# Patient Record
Sex: Female | Born: 1980 | Race: Black or African American | Hispanic: No | Marital: Married | Smoking: Current every day smoker
Health system: Southern US, Community
[De-identification: ages and names within clinical notes are randomized; demographics above are authoritative.]

## PROBLEM LIST (undated history)

## (undated) ENCOUNTER — Inpatient Hospital Stay (HOSPITAL_COMMUNITY): Payer: Self-pay

## (undated) DIAGNOSIS — R87619 Unspecified abnormal cytological findings in specimens from cervix uteri: Secondary | ICD-10-CM

## (undated) DIAGNOSIS — J45909 Unspecified asthma, uncomplicated: Secondary | ICD-10-CM

## (undated) DIAGNOSIS — E669 Obesity, unspecified: Secondary | ICD-10-CM

## (undated) DIAGNOSIS — IMO0002 Reserved for concepts with insufficient information to code with codable children: Secondary | ICD-10-CM

## (undated) DIAGNOSIS — Z8719 Personal history of other diseases of the digestive system: Secondary | ICD-10-CM

## (undated) DIAGNOSIS — I10 Essential (primary) hypertension: Secondary | ICD-10-CM

## (undated) HISTORY — DX: Unspecified abnormal cytological findings in specimens from cervix uteri: R87.619

## (undated) HISTORY — DX: Reserved for concepts with insufficient information to code with codable children: IMO0002

## (undated) HISTORY — DX: Essential (primary) hypertension: I10

---

## 2001-04-17 ENCOUNTER — Emergency Department (HOSPITAL_COMMUNITY): Admission: EM | Admit: 2001-04-17 | Discharge: 2001-04-17 | Payer: Self-pay | Admitting: Emergency Medicine

## 2003-02-27 ENCOUNTER — Ambulatory Visit (HOSPITAL_COMMUNITY): Admission: RE | Admit: 2003-02-27 | Discharge: 2003-02-27 | Payer: Self-pay | Admitting: *Deleted

## 2003-04-12 ENCOUNTER — Encounter (INDEPENDENT_AMBULATORY_CARE_PROVIDER_SITE_OTHER): Payer: Self-pay | Admitting: Specialist

## 2003-04-12 ENCOUNTER — Inpatient Hospital Stay (HOSPITAL_COMMUNITY): Admission: AD | Admit: 2003-04-12 | Discharge: 2003-04-18 | Payer: Self-pay | Admitting: *Deleted

## 2009-04-11 ENCOUNTER — Inpatient Hospital Stay (HOSPITAL_COMMUNITY): Admission: EM | Admit: 2009-04-11 | Discharge: 2009-04-15 | Payer: Self-pay | Admitting: Emergency Medicine

## 2009-07-21 ENCOUNTER — Observation Stay (HOSPITAL_COMMUNITY): Admission: AD | Admit: 2009-07-21 | Discharge: 2009-07-22 | Payer: Self-pay | Admitting: Obstetrics & Gynecology

## 2009-07-21 ENCOUNTER — Ambulatory Visit: Payer: Self-pay | Admitting: Family

## 2009-07-29 ENCOUNTER — Inpatient Hospital Stay (HOSPITAL_COMMUNITY): Admission: AD | Admit: 2009-07-29 | Discharge: 2009-07-29 | Payer: Self-pay | Admitting: Obstetrics & Gynecology

## 2009-07-31 ENCOUNTER — Encounter: Payer: Self-pay | Admitting: Obstetrics & Gynecology

## 2009-07-31 ENCOUNTER — Ambulatory Visit: Payer: Self-pay | Admitting: Obstetrics & Gynecology

## 2009-07-31 LAB — CONVERTED CEMR LAB
ALT: 18 units/L (ref 0–35)
AST: 21 units/L (ref 0–37)
Alkaline Phosphatase: 89 units/L (ref 39–117)
BUN: 4 mg/dL — ABNORMAL LOW (ref 6–23)
CO2: 23 meq/L (ref 19–32)
Calcium: 8.4 mg/dL (ref 8.4–10.5)
Creatinine 24 HR UR: 1608 mg/24hr (ref 700–1800)
Creatinine Clearance: 162 mL/min — ABNORMAL HIGH (ref 75–115)
Creatinine, Ser: 0.69 mg/dL (ref 0.40–1.20)
Glucose, Bld: 88 mg/dL (ref 70–99)
Hgb A: 97.5 % (ref 96.8–97.8)
MCHC: 32 g/dL (ref 30.0–36.0)
Sodium: 137 meq/L (ref 135–145)
Total Bilirubin: 0.2 mg/dL — ABNORMAL LOW (ref 0.3–1.2)

## 2009-08-05 ENCOUNTER — Ambulatory Visit: Payer: Self-pay | Admitting: Obstetrics and Gynecology

## 2009-08-05 LAB — CONVERTED CEMR LAB

## 2009-08-19 ENCOUNTER — Ambulatory Visit: Payer: Self-pay | Admitting: Obstetrics and Gynecology

## 2009-08-21 ENCOUNTER — Ambulatory Visit: Payer: Self-pay | Admitting: Obstetrics & Gynecology

## 2009-08-21 ENCOUNTER — Inpatient Hospital Stay (HOSPITAL_COMMUNITY): Admission: AD | Admit: 2009-08-21 | Discharge: 2009-08-25 | Payer: Self-pay | Admitting: Obstetrics & Gynecology

## 2009-09-02 ENCOUNTER — Ambulatory Visit: Payer: Self-pay | Admitting: Obstetrics and Gynecology

## 2009-09-03 ENCOUNTER — Encounter: Payer: Self-pay | Admitting: Obstetrics and Gynecology

## 2009-09-03 LAB — CONVERTED CEMR LAB
Chlamydia, DNA Probe: NEGATIVE
GC Probe Amp, Genital: NEGATIVE

## 2009-09-09 ENCOUNTER — Ambulatory Visit: Payer: Self-pay | Admitting: Obstetrics and Gynecology

## 2009-09-16 ENCOUNTER — Ambulatory Visit: Payer: Self-pay | Admitting: Obstetrics & Gynecology

## 2009-09-17 ENCOUNTER — Ambulatory Visit: Payer: Self-pay | Admitting: Advanced Practice Midwife

## 2009-09-17 ENCOUNTER — Inpatient Hospital Stay (HOSPITAL_COMMUNITY): Admission: AD | Admit: 2009-09-17 | Discharge: 2009-09-20 | Payer: Self-pay | Admitting: Obstetrics and Gynecology

## 2010-04-06 ENCOUNTER — Emergency Department (HOSPITAL_COMMUNITY)
Admission: EM | Admit: 2010-04-06 | Discharge: 2010-04-06 | Payer: Self-pay | Source: Home / Self Care | Admitting: Emergency Medicine

## 2010-04-09 ENCOUNTER — Emergency Department (HOSPITAL_COMMUNITY)
Admission: EM | Admit: 2010-04-09 | Discharge: 2010-04-09 | Payer: Self-pay | Source: Home / Self Care | Admitting: Emergency Medicine

## 2010-04-09 ENCOUNTER — Emergency Department (HOSPITAL_COMMUNITY)
Admission: EM | Admit: 2010-04-09 | Discharge: 2010-04-09 | Disposition: A | Discharge status: Other Acute Inpatient Hospital | Payer: Self-pay | Source: Home / Self Care | Admitting: Family Medicine

## 2010-07-11 HISTORY — PX: CHOLECYSTECTOMY, LAPAROSCOPIC: SHX56

## 2010-09-23 LAB — HEPATIC FUNCTION PANEL
AST: 128 U/L — ABNORMAL HIGH (ref 0–37)
Albumin: 3.8 g/dL (ref 3.5–5.2)
Alkaline Phosphatase: 347 U/L — ABNORMAL HIGH (ref 39–117)
Indirect Bilirubin: 2.1 mg/dL — ABNORMAL HIGH (ref 0.3–0.9)
Total Bilirubin: 6.3 mg/dL — ABNORMAL HIGH (ref 0.3–1.2)
Total Protein: 7.4 g/dL (ref 6.0–8.3)

## 2010-09-23 LAB — CBC
Hemoglobin: 13.9 g/dL (ref 12.0–15.0)
MCH: 28.8 pg (ref 26.0–34.0)
MCHC: 34.5 g/dL (ref 30.0–36.0)
Platelets: 277 10*3/uL (ref 150–400)
RDW: 13.1 % (ref 11.5–15.5)
WBC: 5.7 10*3/uL (ref 4.0–10.5)

## 2010-09-23 LAB — POCT I-STAT, CHEM 8
Hemoglobin: 15.6 g/dL — ABNORMAL HIGH (ref 12.0–15.0)
Potassium: 3.6 mEq/L (ref 3.5–5.1)
TCO2: 25 mmol/L (ref 0–100)

## 2010-09-23 LAB — POCT URINALYSIS DIPSTICK
Glucose, UA: NEGATIVE mg/dL
Protein, ur: NEGATIVE mg/dL
Urobilinogen, UA: 0.2 mg/dL (ref 0.0–1.0)
pH: 5 (ref 5.0–8.0)

## 2010-09-23 LAB — DIFFERENTIAL
Basophils Relative: 0 % (ref 0–1)
Eosinophils Absolute: 0.3 10*3/uL (ref 0.0–0.7)
Monocytes Absolute: 0.4 10*3/uL (ref 0.1–1.0)
Neutrophils Relative %: 60 % (ref 43–77)

## 2010-09-23 LAB — LIPASE, BLOOD: Lipase: 72 U/L — ABNORMAL HIGH (ref 11–59)

## 2010-09-26 LAB — URINALYSIS, ROUTINE W REFLEX MICROSCOPIC
Bilirubin Urine: NEGATIVE
Bilirubin Urine: NEGATIVE
Glucose, UA: NEGATIVE mg/dL
Ketones, ur: 15 mg/dL — AB
Ketones, ur: 15 mg/dL — AB
Leukocytes, UA: NEGATIVE
Nitrite: NEGATIVE
Nitrite: NEGATIVE
Protein, ur: NEGATIVE mg/dL
Specific Gravity, Urine: 1.02 (ref 1.005–1.030)
Urobilinogen, UA: 0.2 mg/dL (ref 0.0–1.0)
Urobilinogen, UA: 0.2 mg/dL (ref 0.0–1.0)
pH: 6.5 (ref 5.0–8.0)

## 2010-09-26 LAB — CBC
HCT: 34.4 % — ABNORMAL LOW (ref 36.0–46.0)
HCT: 32.1 % — ABNORMAL LOW (ref 36.0–46.0)
Hemoglobin: 11.1 g/dL — ABNORMAL LOW (ref 12.0–15.0)
Hemoglobin: 10.5 g/dL — ABNORMAL LOW (ref 12.0–15.0)
MCHC: 32.3 g/dL (ref 30.0–36.0)
MCV: 80.8 fL (ref 78.0–100.0)
MCV: 80 fL (ref 78.0–100.0)
Platelets: 307 K/uL (ref 150–400)
RBC: 4.26 MIL/uL (ref 3.87–5.11)
RBC: 4.01 MIL/uL (ref 3.87–5.11)
RDW: 15.9 % — ABNORMAL HIGH (ref 11.5–15.5)
WBC: 8.6 K/uL (ref 4.0–10.5)
WBC: 9 10*3/uL (ref 4.0–10.5)

## 2010-09-26 LAB — DIFFERENTIAL
Basophils Absolute: 0 10*3/uL (ref 0.0–0.1)
Basophils Relative: 0 % (ref 0–1)
Eosinophils Absolute: 0.2 10*3/uL (ref 0.0–0.7)
Neutro Abs: 6.4 10*3/uL (ref 1.7–7.7)
Neutrophils Relative %: 75 % (ref 43–77)

## 2010-09-26 LAB — POCT URINALYSIS DIP (DEVICE)
Bilirubin Urine: NEGATIVE
Glucose, UA: NEGATIVE mg/dL
Nitrite: NEGATIVE
pH: 6.5 (ref 5.0–8.0)

## 2010-09-26 LAB — COMPREHENSIVE METABOLIC PANEL
ALT: 22 U/L (ref 0–35)
ALT: 26 U/L (ref 0–35)
AST: 32 U/L (ref 0–37)
AST: 24 U/L (ref 0–37)
Alkaline Phosphatase: 69 U/L (ref 39–117)
BUN: 1 mg/dL — ABNORMAL LOW (ref 6–23)
BUN: 3 mg/dL — ABNORMAL LOW (ref 6–23)
BUN: 5 mg/dL — ABNORMAL LOW (ref 6–23)
CO2: 23 mEq/L (ref 19–32)
CO2: 24 mEq/L (ref 19–32)
Calcium: 8.1 mg/dL — ABNORMAL LOW (ref 8.4–10.5)
Calcium: 9 mg/dL (ref 8.4–10.5)
Chloride: 104 mEq/L (ref 96–112)
Chloride: 104 mEq/L (ref 96–112)
Creatinine, Ser: 0.68 mg/dL (ref 0.4–1.2)
Creatinine, Ser: 0.64 mg/dL (ref 0.4–1.2)
GFR calc non Af Amer: >60 mL/min (ref >60)
GFR calc non Af Amer: >60 mL/min (ref >60)
Glucose, Bld: 143 mg/dL — ABNORMAL HIGH (ref 70–99)
Glucose, Bld: 72 mg/dL (ref 70–99)
Potassium: 3.3 mEq/L — ABNORMAL LOW (ref 3.5–5.1)
Sodium: 135 mEq/L (ref 135–145)
Sodium: 137 mEq/L (ref 135–145)
Total Bilirubin: 0.2 mg/dL — ABNORMAL LOW (ref 0.3–1.2)
Total Bilirubin: 0.5 mg/dL (ref 0.3–1.2)

## 2010-09-26 LAB — WET PREP, GENITAL: Trich, Wet Prep: NONE SEEN

## 2010-09-26 LAB — LIPASE, BLOOD: Lipase: 40 U/L (ref 11–59)

## 2010-09-26 LAB — AMYLASE: Amylase: 233 U/L — ABNORMAL HIGH (ref 0–105)

## 2010-09-26 LAB — RPR: RPR Ser Ql: NONREACTIVE

## 2010-09-26 LAB — URINE MICROSCOPIC-ADD ON

## 2010-09-29 LAB — COMPREHENSIVE METABOLIC PANEL
ALT: 24 U/L (ref 0–35)
ALT: 90 U/L — ABNORMAL HIGH (ref 0–35)
AST: 28 U/L (ref 0–37)
Albumin: 2.9 g/dL — ABNORMAL LOW (ref 3.5–5.2)
Alkaline Phosphatase: 121 U/L — ABNORMAL HIGH (ref 39–117)
Alkaline Phosphatase: 122 U/L — ABNORMAL HIGH (ref 39–117)
Alkaline Phosphatase: 191 U/L — ABNORMAL HIGH (ref 39–117)
BUN: 2 mg/dL — ABNORMAL LOW (ref 6–23)
CO2: 22 mEq/L (ref 19–32)
CO2: 25 mEq/L (ref 19–32)
Chloride: 103 mEq/L (ref 96–112)
Chloride: 105 mEq/L (ref 96–112)
Creatinine, Ser: 0.68 mg/dL (ref 0.4–1.2)
GFR calc Af Amer: >60 mL/min (ref >60)
GFR calc non Af Amer: >60 mL/min (ref >60)
GFR calc non Af Amer: >60 mL/min (ref >60)
Glucose, Bld: 76 mg/dL (ref 70–99)
Potassium: 3.6 mEq/L (ref 3.5–5.1)
Potassium: 4 mEq/L (ref 3.5–5.1)
Potassium: 4.3 mEq/L (ref 3.5–5.1)
Sodium: 135 mEq/L (ref 135–145)
Sodium: 137 mEq/L (ref 135–145)
Total Bilirubin: 0.4 mg/dL (ref 0.3–1.2)
Total Bilirubin: 0.5 mg/dL (ref 0.3–1.2)

## 2010-09-29 LAB — CBC
HCT: 28.4 % — ABNORMAL LOW (ref 36.0–46.0)
HCT: 33.2 % — ABNORMAL LOW (ref 36.0–46.0)
MCHC: 33 g/dL (ref 30.0–36.0)
MCV: 78.1 fL (ref 78.0–100.0)
Platelets: 234 10*3/uL (ref 150–400)
Platelets: 235 10*3/uL (ref 150–400)
Platelets: 288 10*3/uL (ref 150–400)
RBC: 3.63 MIL/uL — ABNORMAL LOW (ref 3.87–5.11)
RBC: 3.73 MIL/uL — ABNORMAL LOW (ref 3.87–5.11)
WBC: 6.3 10*3/uL (ref 4.0–10.5)
WBC: 6.5 10*3/uL (ref 4.0–10.5)
WBC: 7 10*3/uL (ref 4.0–10.5)

## 2010-09-29 LAB — HEPATIC FUNCTION PANEL
ALT: 81 U/L — ABNORMAL HIGH (ref 0–35)
AST: 45 U/L — ABNORMAL HIGH (ref 0–37)
Albumin: 2.3 g/dL — ABNORMAL LOW (ref 3.5–5.2)
Bilirubin, Direct: 0.1 mg/dL (ref 0.0–0.3)
Bilirubin, Direct: 0.2 mg/dL (ref 0.0–0.3)
Indirect Bilirubin: 0.4 mg/dL (ref 0.3–0.9)
Total Bilirubin: 0.6 mg/dL (ref 0.3–1.2)

## 2010-09-29 LAB — POCT URINALYSIS DIP (DEVICE)
Bilirubin Urine: NEGATIVE
Glucose, UA: NEGATIVE mg/dL
Ketones, ur: NEGATIVE mg/dL
Nitrite: NEGATIVE
Protein, ur: 30 mg/dL — AB
pH: 7 (ref 5.0–8.0)

## 2010-09-29 LAB — LIPASE, BLOOD: Lipase: 30 U/L (ref 11–59)

## 2010-10-03 LAB — CBC
HCT: 29.9 % — ABNORMAL LOW (ref 36.0–46.0)
HCT: 33 % — ABNORMAL LOW (ref 36.0–46.0)
Hemoglobin: 10.6 g/dL — ABNORMAL LOW (ref 12.0–15.0)
Hemoglobin: 9.7 g/dL — ABNORMAL LOW (ref 12.0–15.0)
MCHC: 32 g/dL (ref 30.0–36.0)
MCHC: 32.7 g/dL (ref 30.0–36.0)
MCV: 76.2 fL — ABNORMAL LOW (ref 78.0–100.0)
MCV: 76.8 fL — ABNORMAL LOW (ref 78.0–100.0)
MCV: 76.8 fL — ABNORMAL LOW (ref 78.0–100.0)
Platelets: 241 10*3/uL (ref 150–400)
Platelets: 246 10*3/uL (ref 150–400)
RBC: 4.3 MIL/uL (ref 3.87–5.11)
RDW: 17.7 % — ABNORMAL HIGH (ref 11.5–15.5)
WBC: 6.9 10*3/uL (ref 4.0–10.5)

## 2010-10-03 LAB — COMPREHENSIVE METABOLIC PANEL
Albumin: 2.2 g/dL — ABNORMAL LOW (ref 3.5–5.2)
Alkaline Phosphatase: 281 U/L — ABNORMAL HIGH (ref 39–117)
BUN: 2 mg/dL — ABNORMAL LOW (ref 6–23)
Creatinine, Ser: 0.67 mg/dL (ref 0.4–1.2)
Glucose, Bld: 79 mg/dL (ref 70–99)
Potassium: 3.9 mEq/L (ref 3.5–5.1)
Total Bilirubin: 0.4 mg/dL (ref 0.3–1.2)
Total Protein: 5.6 g/dL — ABNORMAL LOW (ref 6.0–8.3)

## 2010-10-03 LAB — AMYLASE: Amylase: 68 U/L (ref 0–105)

## 2010-10-03 LAB — POCT URINALYSIS DIP (DEVICE)
Bilirubin Urine: NEGATIVE
Bilirubin Urine: NEGATIVE
Glucose, UA: NEGATIVE mg/dL
Hgb urine dipstick: NEGATIVE
Ketones, ur: NEGATIVE mg/dL
Protein, ur: NEGATIVE mg/dL
Specific Gravity, Urine: 1.02 (ref 1.005–1.030)
Specific Gravity, Urine: <=1.005 (ref 1.005–1.030)

## 2010-10-03 LAB — LACTATE DEHYDROGENASE: LDH: 177 U/L (ref 94–250)

## 2010-10-03 LAB — URINALYSIS, DIPSTICK ONLY
Bilirubin Urine: NEGATIVE
Ketones, ur: NEGATIVE mg/dL
Nitrite: NEGATIVE
Specific Gravity, Urine: <1.005 — ABNORMAL LOW (ref 1.005–1.030)
Urobilinogen, UA: 0.2 mg/dL (ref 0.0–1.0)
pH: 7 (ref 5.0–8.0)

## 2010-10-03 LAB — URIC ACID: Uric Acid, Serum: 5.6 mg/dL (ref 2.4–7.0)

## 2010-10-14 LAB — DIFFERENTIAL
Basophils Absolute: 0 10*3/uL (ref 0.0–0.1)
Eosinophils Absolute: 0.1 10*3/uL (ref 0.0–0.7)
Eosinophils Relative: 2 % (ref 0–5)
Lymphocytes Relative: 15 % (ref 12–46)
Monocytes Absolute: 0.3 10*3/uL (ref 0.1–1.0)

## 2010-10-14 LAB — BASIC METABOLIC PANEL
BUN: 3 mg/dL — ABNORMAL LOW (ref 6–23)
CO2: 26 mEq/L (ref 19–32)
Chloride: 103 mEq/L (ref 96–112)
Creatinine, Ser: 0.76 mg/dL (ref 0.4–1.2)
Glucose, Bld: 86 mg/dL (ref 70–99)

## 2010-10-14 LAB — CBC
HCT: 32.5 % — ABNORMAL LOW (ref 36.0–46.0)
HCT: 33 % — ABNORMAL LOW (ref 36.0–46.0)
Hemoglobin: 10.9 g/dL — ABNORMAL LOW (ref 12.0–15.0)
MCHC: 33.4 g/dL (ref 30.0–36.0)
MCV: 81.4 fL (ref 78.0–100.0)
Platelets: 258 10*3/uL (ref 150–400)
Platelets: 258 10*3/uL (ref 150–400)
Platelets: 309 10*3/uL (ref 150–400)
RBC: 4.79 MIL/uL (ref 3.87–5.11)
RDW: 15.4 % (ref 11.5–15.5)
WBC: 8.2 10*3/uL (ref 4.0–10.5)
WBC: 8.4 10*3/uL (ref 4.0–10.5)
WBC: 9.1 10*3/uL (ref 4.0–10.5)

## 2010-10-14 LAB — COMPREHENSIVE METABOLIC PANEL
ALT: 12 U/L (ref 0–35)
ALT: 14 U/L (ref 0–35)
AST: 18 U/L (ref 0–37)
AST: 20 U/L (ref 0–37)
Albumin: 2.9 g/dL — ABNORMAL LOW (ref 3.5–5.2)
Albumin: 3.5 g/dL (ref 3.5–5.2)
Alkaline Phosphatase: 48 U/L (ref 39–117)
Alkaline Phosphatase: 51 U/L (ref 39–117)
Alkaline Phosphatase: 62 U/L (ref 39–117)
BUN: 2 mg/dL — ABNORMAL LOW (ref 6–23)
BUN: <1 mg/dL — ABNORMAL LOW (ref 6–23)
CO2: 23 mEq/L (ref 19–32)
Chloride: 103 mEq/L (ref 96–112)
Chloride: 105 mEq/L (ref 96–112)
Chloride: 105 mEq/L (ref 96–112)
GFR calc Af Amer: >60 mL/min (ref >60)
GFR calc Af Amer: >60 mL/min (ref >60)
Glucose, Bld: 112 mg/dL — ABNORMAL HIGH (ref 70–99)
Potassium: 3.3 mEq/L — ABNORMAL LOW (ref 3.5–5.1)
Potassium: 3.5 mEq/L (ref 3.5–5.1)
Potassium: 3.7 mEq/L (ref 3.5–5.1)
Sodium: 135 mEq/L (ref 135–145)
Total Bilirubin: 0.2 mg/dL — ABNORMAL LOW (ref 0.3–1.2)
Total Bilirubin: 0.4 mg/dL (ref 0.3–1.2)
Total Bilirubin: 0.4 mg/dL (ref 0.3–1.2)

## 2010-10-14 LAB — URINALYSIS, ROUTINE W REFLEX MICROSCOPIC
Bilirubin Urine: NEGATIVE
Glucose, UA: NEGATIVE mg/dL
Hgb urine dipstick: NEGATIVE
Protein, ur: NEGATIVE mg/dL

## 2010-10-14 LAB — LIPASE, BLOOD: Lipase: 29 U/L (ref 11–59)

## 2010-10-14 LAB — AMYLASE
Amylase: 106 U/L (ref 27–131)
Amylase: 155 U/L — ABNORMAL HIGH (ref 27–131)

## 2010-11-26 NOTE — Discharge Summary (Signed)
NAME:  [name]                         ACCOUNT NO.:  [id number]   MEDICAL RECORD NO.:  [id number]                   PATIENT TYPE:  INP   LOCATION:  9130                                 FACILITY:  [facility]   PHYSICIAN:  Franchot Mimes, MD                   DATE OF BIRTH:  08/16/1980   DATE OF ADMISSION:  04/12/2003  DATE OF DISCHARGE:  04/18/2003                                 DISCHARGE SUMMARY   DISCHARGE DIAGNOSES:  1. Pregnancy-induced hypertension.  2. Endometritis.  3. Delivery of intrauterine pregnancy.   HISTORY OF PRESENT ILLNESS:  Ms. [name] is a 30 year old G 1 who presented at  40 weeks and 1 day estimated gestational age complaining of painful uterine  contractions.  On admission, she had elevated blood pressures ranging from  in the 140-150s/97-101 range.  In addition, she had 3+ pretibial pitting on  her extremities.  However, she had normal dependent reflexes with no clonus.  The patient was admitted for preeclampsia.  Please see the admission H&P for  full admission details.   HOSPITAL COURSE:  Problem 1.  PREGNANCY-INDUCED HYPERTENSION:  The patient  was admitted to Labor and Delivery, given a 4 gram bolus of magnesium  sulfate followed by 2 grams per hour and also started on Cervidil.  The  Cervidil was then changed to Pitocin later in the morning.  In addition, the  patient was given Stadol for pain relief and magnesium levels were checked  every six hours.  On April 13, 2003, the patient delivered a viable infant  by vacuum-assisted vaginal delivery secondary to nonreassuring fetal heart  tones.  The patient had a second-degree midline perineal laceration repaired  with 3-0 Vicryl under lidocaine anesthesia.  Apgars were 6 and 8 for the  infant.  Magnesium sulfate was discontinued the day following delivery and  the patient was transferred out of the intensive care unit to a regular  floor the day following delivery.   Problem 2.  ENDOMETRITIS:  On October  5th, the patient developed a fever to  101.9 and had increasing abdominal and uterine tenderness.  The patient was  started on gentamicin and clindamycin for a presumed endometritis.  In  addition, urine and blood cultures were sent and IV antibiotics were  continued for three days total.  The patient had been afebrile for greater  than 24 hours at time of discharge.  The patient was then discharged with  Augmentin 875 mg for five additional days.   LABORATORY DATA:  Initial white blood count on October 2nd was 9.3.  Initial  hemoglobin 12.6.  Initial platelets 265.  Final white blood count on October  7th was 8.3, hemoglobin was 10.5, platelets were 258.  PIH panel showed  normal liver function tests, LDH of 156, uric acid of 6.  Urinalysis on  admission showed trace leucocyte esterase, 3-6 white blood cells, and  7-10  red blood cells.  Blood culture obtained October 6th revealed no growth for  five days.  Urine culture obtained October 6th revealed no growth x1 day.  RPR was nonreactive on admission.   DISCHARGE MEDICATIONS:  The patient was discharged home with Augmentin 875  mg p.o. b.i.d. for five days.   DISCHARGE DESTINATION:  The patient was discharged home.    DISCHARGE INSTRUCTIONS:  The patient was instructed to continue her full  course of antibiotics.  She is instructed to return if she developed fevers  again or if she developed any uterine tenderness, nausea, or vomiting.  The  patient was also instructed to keep her postpartum visit at approximately  six weeks.                                               Franchot Mimes, MD    TV/MEDQ  D:  05/19/2003  T:  05/20/2003  Job:  [id number]

## 2010-12-07 ENCOUNTER — Other Ambulatory Visit (HOSPITAL_COMMUNITY): Payer: Self-pay

## 2010-12-07 ENCOUNTER — Inpatient Hospital Stay (HOSPITAL_COMMUNITY)
Admission: EM | Admit: 2010-12-07 | Discharge: 2010-12-12 | DRG: 418 | Disposition: A | Discharge status: Home / Self Care | Payer: Medicaid Other | Source: Other Acute Inpatient Hospital | Attending: General Surgery | Admitting: General Surgery

## 2010-12-07 ENCOUNTER — Emergency Department (HOSPITAL_BASED_OUTPATIENT_CLINIC_OR_DEPARTMENT_OTHER): Payer: Medicaid Other

## 2010-12-07 ENCOUNTER — Emergency Department (HOSPITAL_BASED_OUTPATIENT_CLINIC_OR_DEPARTMENT_OTHER)
Admission: EM | Admit: 2010-12-07 | Discharge: 2010-12-07 | Disposition: A | Discharge status: Other Acute Inpatient Hospital | Payer: Medicaid Other | Attending: Emergency Medicine | Admitting: Emergency Medicine

## 2010-12-07 DIAGNOSIS — R17 Unspecified jaundice: Secondary | ICD-10-CM | POA: Insufficient documentation

## 2010-12-07 DIAGNOSIS — K8020 Calculus of gallbladder without cholecystitis without obstruction: Secondary | ICD-10-CM

## 2010-12-07 DIAGNOSIS — R0789 Other chest pain: Secondary | ICD-10-CM | POA: Diagnosis not present

## 2010-12-07 DIAGNOSIS — K859 Acute pancreatitis without necrosis or infection, unspecified: Secondary | ICD-10-CM | POA: Insufficient documentation

## 2010-12-07 DIAGNOSIS — K8050 Calculus of bile duct without cholangitis or cholecystitis without obstruction: Secondary | ICD-10-CM

## 2010-12-07 DIAGNOSIS — K8066 Calculus of gallbladder and bile duct with acute and chronic cholecystitis without obstruction: Secondary | ICD-10-CM | POA: Diagnosis present

## 2010-12-07 DIAGNOSIS — R748 Abnormal levels of other serum enzymes: Secondary | ICD-10-CM | POA: Insufficient documentation

## 2010-12-07 DIAGNOSIS — R9431 Abnormal electrocardiogram [ECG] [EKG]: Secondary | ICD-10-CM | POA: Diagnosis not present

## 2010-12-07 DIAGNOSIS — R7309 Other abnormal glucose: Secondary | ICD-10-CM | POA: Insufficient documentation

## 2010-12-07 DIAGNOSIS — I10 Essential (primary) hypertension: Secondary | ICD-10-CM | POA: Diagnosis present

## 2010-12-07 DIAGNOSIS — R1011 Right upper quadrant pain: Secondary | ICD-10-CM | POA: Insufficient documentation

## 2010-12-07 LAB — GLUCOSE, CAPILLARY
Glucose-Capillary: 102 mg/dL — ABNORMAL HIGH (ref 70–99)
Glucose-Capillary: 142 mg/dL — ABNORMAL HIGH (ref 70–99)

## 2010-12-07 LAB — DIFFERENTIAL
Basophils Relative: 0 % (ref 0–1)
Monocytes Relative: 8 % (ref 3–12)
Neutro Abs: 9.6 10*3/uL — ABNORMAL HIGH (ref 1.7–7.7)
Neutrophils Relative %: 77 % (ref 43–77)

## 2010-12-07 LAB — CBC
Hemoglobin: 12.4 g/dL (ref 12.0–15.0)
MCH: 25.4 pg — ABNORMAL LOW (ref 26.0–34.0)
RBC: 4.88 MIL/uL (ref 3.87–5.11)
WBC: 12.5 10*3/uL — ABNORMAL HIGH (ref 4.0–10.5)

## 2010-12-07 LAB — COMPREHENSIVE METABOLIC PANEL
ALT: 319 U/L — ABNORMAL HIGH (ref 0–35)
AST: 157 U/L — ABNORMAL HIGH (ref 0–37)
Albumin: 3.9 g/dL (ref 3.5–5.2)
Alkaline Phosphatase: 205 U/L — ABNORMAL HIGH (ref 39–117)
CO2: 24 mEq/L (ref 19–32)
Chloride: 102 mEq/L (ref 96–112)
Creatinine, Ser: 0.8 mg/dL (ref 0.4–1.2)
GFR calc Af Amer: >60 mL/min (ref >60)
GFR calc non Af Amer: >60 mL/min (ref >60)
Potassium: 3.6 mEq/L (ref 3.5–5.1)
Sodium: 141 mEq/L (ref 135–145)
Total Bilirubin: 3.4 mg/dL — ABNORMAL HIGH (ref 0.3–1.2)

## 2010-12-07 LAB — URINALYSIS, ROUTINE W REFLEX MICROSCOPIC
Ketones, ur: NEGATIVE mg/dL
Nitrite: NEGATIVE
Urobilinogen, UA: 1 mg/dL (ref 0.0–1.0)

## 2010-12-07 LAB — URINE MICROSCOPIC-ADD ON

## 2010-12-07 LAB — PREGNANCY, URINE: Preg Test, Ur: NEGATIVE

## 2010-12-08 ENCOUNTER — Inpatient Hospital Stay (HOSPITAL_COMMUNITY): Payer: Medicaid Other

## 2010-12-08 DIAGNOSIS — K8050 Calculus of bile duct without cholangitis or cholecystitis without obstruction: Secondary | ICD-10-CM

## 2010-12-08 DIAGNOSIS — R17 Unspecified jaundice: Secondary | ICD-10-CM

## 2010-12-08 DIAGNOSIS — K859 Acute pancreatitis without necrosis or infection, unspecified: Secondary | ICD-10-CM

## 2010-12-08 LAB — GLUCOSE, CAPILLARY
Glucose-Capillary: 100 mg/dL — ABNORMAL HIGH (ref 70–99)
Glucose-Capillary: 83 mg/dL (ref 70–99)

## 2010-12-08 LAB — COMPREHENSIVE METABOLIC PANEL
Albumin: 3.1 g/dL — ABNORMAL LOW (ref 3.5–5.2)
BUN: 5 mg/dL — ABNORMAL LOW (ref 6–23)
Chloride: 105 mEq/L (ref 96–112)
Creatinine, Ser: 0.69 mg/dL (ref 0.4–1.2)
Glucose, Bld: 82 mg/dL (ref 70–99)
Total Bilirubin: 3.3 mg/dL — ABNORMAL HIGH (ref 0.3–1.2)
Total Protein: 6.6 g/dL (ref 6.0–8.3)

## 2010-12-08 LAB — CBC
MCH: 25.8 pg — ABNORMAL LOW (ref 26.0–34.0)
MCHC: 32.6 g/dL (ref 30.0–36.0)
MCV: 79.2 fL (ref 78.0–100.0)
Platelets: 251 10*3/uL (ref 150–400)

## 2010-12-08 LAB — LIPASE, BLOOD: Lipase: 1197 U/L — ABNORMAL HIGH (ref 11–59)

## 2010-12-09 ENCOUNTER — Inpatient Hospital Stay (HOSPITAL_COMMUNITY): Payer: Medicaid Other

## 2010-12-09 DIAGNOSIS — R740 Nonspecific elevation of levels of transaminase and lactic acid dehydrogenase [LDH]: Secondary | ICD-10-CM

## 2010-12-09 DIAGNOSIS — K859 Acute pancreatitis without necrosis or infection, unspecified: Secondary | ICD-10-CM

## 2010-12-09 DIAGNOSIS — R9431 Abnormal electrocardiogram [ECG] [EKG]: Secondary | ICD-10-CM

## 2010-12-09 DIAGNOSIS — R079 Chest pain, unspecified: Secondary | ICD-10-CM

## 2010-12-09 DIAGNOSIS — K8050 Calculus of bile duct without cholangitis or cholecystitis without obstruction: Secondary | ICD-10-CM

## 2010-12-09 LAB — GLUCOSE, CAPILLARY
Glucose-Capillary: 127 mg/dL — ABNORMAL HIGH (ref 70–99)
Glucose-Capillary: 81 mg/dL (ref 70–99)
Glucose-Capillary: 91 mg/dL (ref 70–99)

## 2010-12-09 LAB — CBC
HCT: 33.6 % — ABNORMAL LOW (ref 36.0–46.0)
Hemoglobin: 10.8 g/dL — ABNORMAL LOW (ref 12.0–15.0)
MCHC: 32.1 g/dL (ref 30.0–36.0)
RBC: 4.3 MIL/uL (ref 3.87–5.11)

## 2010-12-09 LAB — COMPREHENSIVE METABOLIC PANEL
ALT: 156 U/L — ABNORMAL HIGH (ref 0–35)
AST: 55 U/L — ABNORMAL HIGH (ref 0–37)
Alkaline Phosphatase: 206 U/L — ABNORMAL HIGH (ref 39–117)
CO2: 28 mEq/L (ref 19–32)
Calcium: 8.5 mg/dL (ref 8.4–10.5)
Chloride: 102 mEq/L (ref 96–112)
GFR calc Af Amer: >60 mL/min (ref >60)
GFR calc non Af Amer: >60 mL/min (ref >60)
Glucose, Bld: 77 mg/dL (ref 70–99)
Potassium: 3.3 mEq/L — ABNORMAL LOW (ref 3.5–5.1)
Sodium: 138 mEq/L (ref 135–145)
Total Bilirubin: 1 mg/dL (ref 0.3–1.2)

## 2010-12-09 LAB — SURGICAL PCR SCREEN: MRSA, PCR: NEGATIVE

## 2010-12-09 LAB — CK TOTAL AND CKMB (NOT AT ARMC)
CK, MB: 1.2 ng/mL (ref 0.3–4.0)
Total CK: 666 U/L — ABNORMAL HIGH (ref 7–177)

## 2010-12-09 LAB — AMYLASE: Amylase: 139 U/L — ABNORMAL HIGH (ref 0–105)

## 2010-12-10 ENCOUNTER — Other Ambulatory Visit (INDEPENDENT_AMBULATORY_CARE_PROVIDER_SITE_OTHER): Payer: Self-pay | Admitting: General Surgery

## 2010-12-10 ENCOUNTER — Inpatient Hospital Stay (HOSPITAL_COMMUNITY): Payer: Medicaid Other

## 2010-12-10 LAB — CBC
Hemoglobin: 10.6 g/dL — ABNORMAL LOW (ref 12.0–15.0)
MCH: 25.4 pg — ABNORMAL LOW (ref 26.0–34.0)
MCHC: 32.3 g/dL (ref 30.0–36.0)
RDW: 14.9 % (ref 11.5–15.5)

## 2010-12-10 LAB — GLUCOSE, CAPILLARY
Glucose-Capillary: 173 mg/dL — ABNORMAL HIGH (ref 70–99)
Glucose-Capillary: 69 mg/dL — ABNORMAL LOW (ref 70–99)
Glucose-Capillary: 92 mg/dL (ref 70–99)
Glucose-Capillary: 95 mg/dL (ref 70–99)
Glucose-Capillary: 98 mg/dL (ref 70–99)

## 2010-12-10 LAB — COMPREHENSIVE METABOLIC PANEL
ALT: 124 U/L — ABNORMAL HIGH (ref 0–35)
AST: 39 U/L — ABNORMAL HIGH (ref 0–37)
Albumin: 3 g/dL — ABNORMAL LOW (ref 3.5–5.2)
Alkaline Phosphatase: 180 U/L — ABNORMAL HIGH (ref 39–117)
Chloride: 104 mEq/L (ref 96–112)
GFR calc Af Amer: >60 mL/min (ref >60)
Potassium: 3.6 mEq/L (ref 3.5–5.1)
Sodium: 139 mEq/L (ref 135–145)
Total Bilirubin: 0.7 mg/dL (ref 0.3–1.2)

## 2010-12-10 LAB — TROPONIN I: Troponin I: <0.3 ng/mL (ref <0.30)

## 2010-12-10 LAB — CK TOTAL AND CKMB (NOT AT ARMC)
CK, MB: 1.2 ng/mL (ref 0.3–4.0)
Total CK: 473 U/L — ABNORMAL HIGH (ref 7–177)

## 2010-12-10 NOTE — H&P (Signed)
NAMELEIGHANA, NEYMAN NO.:  [id number]  MEDICAL RECORD NO.:  [id number]           PATIENT TYPE:  I  LOCATION:  5036                         FACILITY:  [facility]  PHYSICIAN:  Eduard Clos, MDDATE OF BIRTH:  07-15-1980  DATE OF ADMISSION:  12/07/2010 DATE OF DISCHARGE:                             HISTORY & PHYSICAL   PRIMARY CARE PHYSICIAN:  Unassigned.  CHIEF COMPLAINT:  Abdominal pain.  HISTORY OF PRESENT ILLNESS:  This is a 30 year old female with previous history of cholelithiasis and pancreatitis last year who had presented with complaint of worsening abdominal pain with nausea, vomiting over the last 4-5 days.  The patient states the abdominal pain started on Wednesday that is almost 4-5 days which is slowly worsening.  The pain is mostly in the right upper quadrant, has become more constant, and last midnight, she started throwing up and she decided to come to the ER.  In the ER, the patient was found to have lipase more than 3000 with known history of gallbladder stones.  The patient has been admitted for acute pancreatitis, probably from gallstones.  The patient denies any chest pain, shortness of breath, denies any dizziness, loss of conscious, any focal deficit, denies any dysuria, discharges, or diarrhea.  PAST MEDICAL HISTORY:  History of cholelithiasis and pancreatitis from the same.  MEDICATIONS PRIOR TO ADMISSION:  None.  SOCIAL HISTORY:  The patient denies smoking cigarettes, drinking alcohol, using illegal drugs.  ALLERGIES:  No known drug allergies.  FAMILY HISTORY:  Positive for diabetes and hypertension.  REVIEW OF SYSTEMS:  As per the history of present illness, nothing else significant.  PHYSICAL EXAMINATION:  GENERAL:  The patient was examined at bedside, not in acute distress. VITAL SIGNS:  Blood pressure 148/90, pulse 80 per minute, temperature 97.4, respirations 18 per minute, O2 sat is 96% on room air. HEENT:   Anicteric.  No pallor.  No discharge from ears, eyes, nose, or mouth. CHEST:  Bilateral air entry present.  No rhonchi, no crepitation. HEART:  S1 and S2 heard. ABDOMEN:  Soft.  There is tenderness in the right upper quadrant and epigastric area.  No guarding or rigidity.  Bowel sounds heard. CENTRAL NERVOUS SYSTEM:  The patient is alert, awake, and oriented to time, place, and person, moves upper and lower extremities 5/5. EXTREMITIES:  Peripheral pulses felt.  No edema.  LABORATORY DATA:  CBC:  WBCs 12.5, hemoglobin is 12.4, hematocrit is 37, platelets 272.  Complete metabolic panel:  Sodium 141, potassium 3.6, chloride 102, carbon dioxide 24, glucose 146, BUN 9, creatinine 0.8, total bilirubin is 3.4, alkaline phosphatase 205, AST 157, ALT 319, calcium 9.6, lipase more than 3000.  Pregnancy screen is negative.  UA is negative for nitrites or leukocytes, glucose negative, ketones negative, and wbc's 0-3, rbc's 0-2, bacteria few.  ASSESSMENT:  Acute gallstone pancreatitis.  PLAN: 1. At this time, we will admit the patient to medical floor. 2. At this time, I did discuss with the on-call of gastroenterologist,     Dr. Arlyce Dice, who is going to see the patient.  At this time, we will  be ordering sonogram of the abdomen.  We will be keeping the     patient n.p.o.  We will be placing the patient on pain relief     medication, aggressively hydrate the patient.  As the patient also     has had previously some gallbladder thickening, I am going to keep     the patient on empiric antibiotics until we receive the sonogram     results and also Gastroenterology recommendations.  Further     recommendation as condition evolves.     Eduard Clos, MD     ANK/MEDQ  D:  12/07/2010  T:  12/07/2010  Job:  [id number]  Electronically Signed by Midge Minium MD on 12/10/2010 07:48:38 AM

## 2010-12-11 LAB — GLUCOSE, CAPILLARY
Glucose-Capillary: 135 mg/dL — ABNORMAL HIGH (ref 70–99)
Glucose-Capillary: 107 mg/dL — ABNORMAL HIGH (ref 70–99)

## 2010-12-11 LAB — TROPONIN I: Troponin I: <0.3 ng/mL (ref <0.30)

## 2010-12-11 LAB — CK TOTAL AND CKMB (NOT AT ARMC)
Relative Index: 0.4 (ref 0.0–2.5)
Total CK: 283 U/L — ABNORMAL HIGH (ref 7–177)

## 2010-12-14 NOTE — Op Note (Signed)
NAMESEATTLE, DALPORTO NO.:  [id number]  MEDICAL RECORD NO.:  [id number]           PATIENT TYPE:  I  LOCATION:  5036                         FACILITY:  [facility]  PHYSICIAN:  Ollen Gross. Vernell Morgans, M.D. DATE OF BIRTH:  08-22-1980  DATE OF PROCEDURE:  12/10/2010 DATE OF DISCHARGE:                              OPERATIVE REPORT   PREOPERATIVE DIAGNOSIS:  Gallstones with common duct stones and cholecystitis.  POSTOPERATIVE DIAGNOSIS:  Gallstones with common duct stones and cholecystitis.  PROCEDURE:  Laparoscopic cholecystectomy with intraoperative cholangiogram.  SURGEON:  Ollen Gross. Vernell Morgans, MD  ASSISTANT:  Brayton El, PA-C  ANESTHESIA:  General endotracheal.  PROCEDURE:  After informed consent was obtained, the patient was brought to the operating room, placed in supine position on the operating room table.  After an induction of general anesthesia, the patient's head was prepped with ChloraPrep, allowed to dry, and draped in usual sterile manner.  The area below the umbilicus was infiltrated with 0.25% Marcaine.  A small incision was made with a 15-blade knife.  This incision was carried down through the subcutaneous tissue bluntly with a hemostat and [employer] retractors until the linea alba was identified. The linea alba was incised with a 15-blade knife and each side was grasped with Kocher clamps and elevated anteriorly.  The preperitoneal space was then probed bluntly with a hemostat until the peritoneum was opened.  An access was gained to the abdominal cavity.  A 0 Vicryl pursestring stitch was placed in the fascia around the opening.  A Hasson cannula was placed through the opening and anchored in place with a previously placed Vicryl pursestring stitch.  The abdomen was then insufflated with carbon dioxide without difficulty.  The patient was placed in reverse Trendelenburg position and rotated with a right side up.  A laparoscope was inserted through  the Hasson cannula, and the right upper quadrant was inspected.  Next, the epigastric region was infiltrated with 0.25% Marcaine.  A small incision was made with a 15- blade knife and a 10-mm port was placed bluntly through this incision into the abdominal cavity under direct vision.  Sites were then chosen laterally on the right side of the abdomen with placement of two 5-mm ports.  These areas were infiltrated with 0.25% Marcaine.  A small stab incision was made with a 15-blade knife and a 5-mm port was placed bluntly through these incisions into the abdominal cavity under direct vision.  A blunt grasper was placed through the lateral most 5-mm port and used to grasp the dome of the gallbladder and elevated anteriorly and superiorly.  The gallbladder was very inflamed and thick, but we were able to get hold of it.  Another blunt grasper was placed through the other 5-mm port and used to retract on the body and neck of the gallbladder.  Dissector was placed through the epigastric port and using the electrocautery, the peritoneal reflection at the gallbladder neck area was opened.  Blunt dissection was then carried out in this area until the gallbladder neck cystic duct junction was readily identified and a good window was created.  The cystic duct  appeared to be too thick to get a clip around it.  We made a small ductotomy and we thought was the gallbladder neck.  We placed the Reddick 14-gauge Angiocath percutaneously through the anterior abdominal wall under direct vision. Placed a Reddick catheter through the Angiocath..  The Reddick catheter was flushed and then placed in the cystic duct and the balloon was inflated.  We then obtained a cholangiogram that did showed no filling defects, good emptying in duodenum and adequate length on the cystic duct.  The balloon was in the cystic duct.  We then let the balloon down removed the catheter.  We divided the rest of the cystic duct and  then placed 2 Endoloops around the cystic duct stump to control it. Posterior to this, the cystic artery was identified and again decision dissected bluntly in a circumferential manner until a good window was created.  Two clips were placed proximally and 1 distally on the artery, and the artery was divided between the 2.  Next, a laparoscopic hook cautery device was used to separate the gallbladder from the liver bed. Prior to completely detaching the gallbladder from the liver bed, the liver bed was inspected and several small bleeding points were coagulated with electrocautery until the area was completely hemostatic. The gallbladder was then detached and rest away from the liver bed without difficulty.  A laparoscopic bag was inserted through the epigastric port.  The gallbladder was placed in the bag and the bag was sealed.  The abdomen was then irrigated with copious amounts of saline until the effluent was clear.  The laparoscope was then moved to the epigastric port.  A gallbladder grasper was placed through the Hasson cannula and used to grasp the opening of the bag.  The bag with the gallbladder was removed with the Hasson cannula through the infraumbilical port without difficulty.  The fascial defect was closed with previously placed Vicryl pursestring stitch as well as with another figure-of-eight 0 Vicryl stitch.  The rest of the ports were removed under direct vision are found to be hemostatic.  The gas was allowed to escape.  The skin incisions were all closed with interrupted 4-0 Monocryl subcuticular stitches and Dermabond dressings were applied. The patient tolerated the procedure well.  At the end of case, all needle, sponge and instruments counts are correct.  The patient was then awakened and taken to recovery in stable condition.     Ollen Gross. Vernell Morgans, M.D.     PST/MEDQ  D:  12/10/2010  T:  12/10/2010  Job:  [id number]  Electronically Signed by Chevis Pretty III  M.D. on 12/14/2010 04:57:13 PM

## 2010-12-27 NOTE — Discharge Summary (Signed)
[name], [name] NO.:  [id number]  MEDICAL RECORD NO.:  [id number]           PATIENT TYPE:  I  LOCATION:  5036                         FACILITY:  [facility]  PHYSICIAN:  Ollen Gross. Vernell Morgans, M.D. DATE OF BIRTH:  Dec 04, 1980  DATE OF ADMISSION:  12/07/2010 DATE OF DISCHARGE:  12/12/2010                              DISCHARGE SUMMARY   ADMISSION DIAGNOSES: 1. Gallstone pancreatitis. 2. Cholelithiasis.  DISCHARGE DIAGNOSES: 1. Gallstone pancreatitis. 2. Cholelithiasis.  PROCEDURES: 1. Endoscopic retrograde cholangiopancreatography, sphincterotomy with     stones in the common bile duct on Dec 08, 2010, Dr. Juanda Chance. 2. Laparoscopic cholecystectomy on December 10, 2010, Dr. Carolynne Edouard.  BRIEF HISTORY:  The patient is a 30 year old black female with a prior history of cholelithiasis and pancreatitis who presented with complaints of pain, nausea, and vomiting over the last 4-5 days.  She had a similar problem back in January 2012, at which time she was pregnant at [redacted] weeks.  Night prior to admission, she started throwing up.  She presented to the ER with lipase of greater than 3000 and was admitted by the hospitalist for acute pancreatitis and probable gallstones. Reviewing the history, the patient has had a problem with gallstones dating back to at least October 2010.  There are five different ultrasounds dating from October 2010 showing gallstones.  After admission, she was placed on antibiotics and seen by GI.  She was ultimately seen by Dr. Lina Sar, and they scheduled her for an ERCP the following day.  She was taken to the endoscopy suite and underwent ERCP, sphincterotomy, and removal of common bile duct stone on Dec 08, 2010.  She tolerated this well.  During the interim time, her pancreatitis was resolving and by December 10, 2010, she was doing well.  It was Dr. Billey Chang opinion that she could undergo laparoscopic cholecystectomy at that time.  She was taken to the OR  on December 10, 2010. Her lipase was down to 81 on Dec 09, 2010.  She underwent laparoscopic cholecystectomy without problems.  She did have some complaints of chest pain which occurred on Dec 09, 2010, after her ERCP.  On examination, she had a localized tenderness in her chest which corresponded to the area of left anterior chest between left third and fourth intercostal spaces.  It could be reproduced by palpation and improved with stretching.  We did an EKG at that point which showed T-wave inversion in V1 through V5 and lead III.  After reviewing the studies, we discussed the EKG with Anesthesia and they felt it would be better if she had a Cardiology consult prior to surgery.  Cardiology saw the patient and agreed with plans to go ahead with the surgery and noted they will get an echo on her postoperatively just to be sure everything was normal.  This was ordered, but the patient said she did not understand and subsequently refused.  At that point, they recommended she follow up with her primary care.  She did have one CK that wasslightly elevated and after the complaints of chest pain, troponins were both less than 0.3.  The patient was  subsequently mobilized postop and was ready for discharge in the a.m. of December 12, 2010.  DISCHARGE MEDICATION:  Percocet 1-2 cm q.4 h. p.r.n.  She will [facility] to the [facility] on December 28, 2010, and she has a followup appointment with Dr. Concepcion Elk on December 31, 2010.  We will send this information to Dr. Concepcion Elk for his followup.  CONDITION ON DISCHARGE:  Improved.     Eber Hong, P.A.   ______________________________ Ollen Gross. Vernell Morgans, M.D.    WDJ/MEDQ  D:  12/11/2010  T:  12/12/2010  Job:  [id number]  cc:   Rollene Rotunda, MD, [facility] Fleet Contras, M.D. [employer]/product designer by Sherrie George P.A. on 12/23/2010 03:47:40 PM Electronically Signed by Chevis Pretty III M.D. on 12/27/2010 07:31:22 AM

## 2010-12-29 NOTE — Consult Note (Signed)
[name], [name] NO.:  [id number]  MEDICAL RECORD NO.:  [id number]           PATIENT TYPE:  LOCATION:                                 FACILITY:  PHYSICIAN:  Rollene Rotunda, MD, FACCDATE OF BIRTH:  Oct 03, 1980  DATE OF CONSULTATION:  12/09/2010 DATE OF DISCHARGE:                                CONSULTATION   REQUESTING PHYSICIAN:  Ollen Gross. Vernell Morgans, MD  REASON FOR CONSULTATION:  Evaluate the patient with chest pain and abnormal EKG.  HISTORY OF PRESENT ILLNESS:  The patient is a very pleasant 30 year old female without prior cardiac history.  She is admitted with cholelithiasis and pancreatitis as a result of this.  She is to have laparoscopic cholecystectomy.  She had an ERCP yesterday.  She does not recall this since she was anesthetized, but she apparently had some nausea and vomiting afterwards.  She noticed when she did wake up that she had some chest soreness.  She points to the localized area in her left upper chest.  She says it is a mild soreness.  It has been constant since the procedure.  She has never had this before.  It does not radiate.  She has not had any other symptoms such as nausea, vomiting, or diaphoresis aside from the initial vomiting.  She said the discomfort is more noticeable if she stretches or moves in a certain way.  However, she did get an EKG which demonstrated poor anterior R-wave progression and anterior T-wave inversions.  There is also some mild inferior T-wave flattening.  There is no old EKGs for comparison.  Otherwise, the patient has been healthy prior to this.  She did have gallstone pancreatitis previously but this was managed medically as she was pregnant at that time.  She otherwise does activities of daily living but does not exercise routinely.  With her activity, she denies any chest pressure, neck or arm discomfort.  She does not report palpitations, presyncope, or syncope.  She has had no shortness  of breath, PND, or orthopnea.  PAST MEDICAL HISTORY:  Pancreatitis and cholelithiasis.  She has had no history of hypertension, diabetes, or hyperlipidemia.  PAST SURGICAL HISTORY:  None.  ALLERGIES:  None.  MEDICATIONS:  (Prior to admission) None.  SOCIAL HISTORY:  The patient smoked briefly, quitting in July.  She has three children.  FAMILY HISTORY:  Noncontributory for early coronary disease.  REVIEW OF SYSTEMS:  As stated in the HPI and negative for all other systems.  PHYSICAL EXAMINATION:  GENERAL:  The patient is in no distress. VITAL SIGNS:  Blood pressure 137/91, heart rate 98 and regular, afebrile, respiratory rate 16. HEENT:  Eyelids unremarkable, pupils equal, round, reactive to light, fundi not visualized, oral mucosa unremarkable. NECK:  No jugular venous distention at 45 degrees.  Carotid upstroke brisk and symmetric, no bruits, no thyromegaly. LYMPHATICS:  No cervical, axillary, inguinal adenopathy. LUNGS:  Clear to auscultation bilaterally. BACK:  No costovertebral angle tenderness. CHEST:  Unremarkable.  No tenderness. HEART:  PMI not displaced or sustained, S1 and S2 within normal limits, no S3, no S4, no clicks, no rubs, no murmurs. ABDOMEN:  Obese, positive bowel sounds normal in frequency and pitch, no bruits, no rebound, no guarding, no midline pulsatile mass, no hepatomegaly, no splenomegaly. SKIN:  No rashes, no nodules. EXTREMITIES:  Pulses 2+ throughout, no edema, no cyanosis, no clubbing. NEUROLOGIC:  Oriented to person, place, and time, cranial nerves II through XII grossly intact, motor grossly intact.  EKG sinus rhythm, poor anterior R-wave progression, rate 87, axis within normal limits, intervals within normal limits, anterolateral T-wave inversions without old EKGs for comparison.  LABORATORY DATA:  WBC 8.3, hemoglobin 10.8, platelets 224.  Sodium 138, potassium 3.3, BUN 4, creatinine 0.72.  ASSESSMENT AND PLAN: 1. Abnormal EKG.  The  patient does have an abnormal EKG.  However, her     chest pain is very atypical for angina.  She has no significant     cardiovascular risk factors.  I doubt very strongly obstructive     coronary disease as an etiology.  I will check an echocardiogram to     look for any left ventricular dysfunction or wall motion     abnormalities; however, she would be an acceptable risk for the     planned surgery.  She should have followup EKG prior to discharge. 2. Pancreatitis/cholelithiasis.  As above, the patient will be     acceptable risk for planned surgery.     Rollene Rotunda, MD, [facility]     JH/MEDQ  D:  12/09/2010  T:  12/10/2010  Job:  [id number]  Electronically Signed by Rollene Rotunda MD [facility] on 12/29/2010 11:44:48 AM

## 2011-01-11 NOTE — Consult Note (Signed)
Brittney [name], Brittney [name]  MEDICAL RECORD NO.:  [name]           PATIENT TYPE:  I  LOCATION:  5036                         FACILITY:  [facility]  PHYSICIAN:  Ollen Gross. Vernell Morgans, M.D. DATE OF BIRTH:  [birth year]  DATE OF CONSULTATION:  12/07/2010 DATE OF DISCHARGE:                                CONSULTATION   REFERRING PHYSICIAN:  Hedwig Morton. Juanda Chance, MD  CONSULTING SURGEON:  Ollen Gross. Vernell Morgans, MD  REASON FOR CONSULTATION:  Biliary pancreatitis and gallstones.  HISTORY OF PRESENT ILLNESS:  Brittney [name] is a patient who is 30 years' old.  She is an [place] American female who has a prior history of cholelithiasis and prior history of biliary pancreatitis, who presented with about 4 to 5 days of abdominal pain, nausea, vomiting.  We had previously seen her in February 2011 when she was diagnosed with gallstones.  However, this time she came into the emergency department and found to have a lipase greater than 3000 and elevated liver enzymes. She was admitted by the Hospitalist Service and is now been consulted upon by Gastroenterology who also recommends surgical intervention.  The patient was has not been compliant with followup as an outpatient.  She denies any fever or chills.  She actually states she is feeling considerably better since being admitted.  PAST MEDICAL HISTORY:  Negative except for previous bouts of biliary pancreatitis.  PAST SURGICAL HISTORY:  Negative.  FAMILY HISTORY:  Positive for diabetes and hypertension.  SOCIAL HISTORY:  The patient denies any tobacco, alcohol, or illicit drug use.  MEDICATIONS:  None.  ALLERGIES:  None.  REVIEW OF SYSTEMS:  Please see history present illness for pertinent findings.  Otherwise, complete 12-system review found to be negative.  PHYSICAL EXAMINATION:  GENERAL:  Reveals a 30 year old [place] American female who is obese, but not in acute distress. VITAL SIGNS:  Current temperature of 98.0,  heart rate of 105, blood pressure 129/80, respiratory rate of 16, oxygen saturation 99% on room air. ENT:  Unremarkable. NECK:  Supple without lymphadenopathy.  Trachea is midline.  No thyromegaly or masses. LUNGS:  Clear to auscultation.  No wheezes, rhonchi, or rales. HEART:  Regular rate and rhythm.  No murmurs, gallops, or rubs. Carotids 2+ and brisk without bruits.  Peripheral pulses intact and symmetrical. ABDOMEN:  Soft, nondistended.  She is tender in the right upper quadrant and epigastric region without evidence of peritonitis.  There is no mass effect or hernias appreciated.  She has active, but quiet bowel sounds. GENITOURINARY:  Exam is deferred. RECTAL:  Exam is deferred. SKIN:  Otherwise warm and dry with good turgor.  No appreciable jaundice.  No rashes, lesions, or nodules. NEUROLOGIC:  The patient is alert and oriented x3.  DIAGNOSTIC DATA:  CBC today reveals a white blood cell count of 12.5, hemoglobin of 12.4, hematocrit of 37.0, platelet count 272.  Metabolic panel shows a sodium of 141, potassium of 3.6, chloride of 102, CO2 of 24, BUN of 9, creatinine of 0.8, glucose of 146.  Liver enzymes showed an elevated bilirubin of 3.4, alkaline phosphatase of 205, AST of 157,  ALT of 319, lipase greater than 3000.  IMAGING DATA:  No imaging has been yet performed on this exam.  IMPRESSION: 1. 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 2. Probable chronic cholecystitis.  PLAN:  I agree with Gastroenterology for endoscopy/ERCP.  We will plan laparoscopic cholecystectomy once her pancreatitis resolves and her ERCP is completed.  I have discuss this with the patient who seems agreeable to our plan for surgical intervention this admission.     Brayton El, PA-C   ______________________________ Ollen Gross. Vernell Morgans, M.D.    KB/MEDQ  D:  12/07/2010  T:  12/08/2010  Job:  [id number]  Electronically Signed by Brayton El  on 01/03/2011 02:34:16  PM Electronically Signed by Chevis Pretty III M.D. on 01/11/2011 09:18:49 AM

## 2011-04-26 ENCOUNTER — Inpatient Hospital Stay (INDEPENDENT_AMBULATORY_CARE_PROVIDER_SITE_OTHER)
Admission: RE | Admit: 2011-04-26 | Discharge: 2011-04-26 | Disposition: A | Discharge status: Home / Self Care | Payer: Self-pay | Source: Ambulatory Visit | Attending: Family Medicine | Admitting: Family Medicine

## 2011-04-26 DIAGNOSIS — S8000XA Contusion of unspecified knee, initial encounter: Secondary | ICD-10-CM

## 2011-04-26 DIAGNOSIS — S335XXA Sprain of ligaments of lumbar spine, initial encounter: Secondary | ICD-10-CM

## 2011-11-16 IMAGING — US US OB COMP +14 WK
1 series · 14 of 28 positions shown · non-contrast
Comparison: none

OBSTETRICAL ULTRASOUND:
 This ultrasound exam was performed in the [HOSPITAL] Ultrasound Department.  The OB US report was generated in the AS system, and faxed to the ordering physician.  This report is also available in [HOSPITAL]?s [employer] and in [REDACTED] PACS.

[Series 1: us ob comp +14 wk · 0.24mm/px · 14 of 55 slices shown]
[im 3/55]
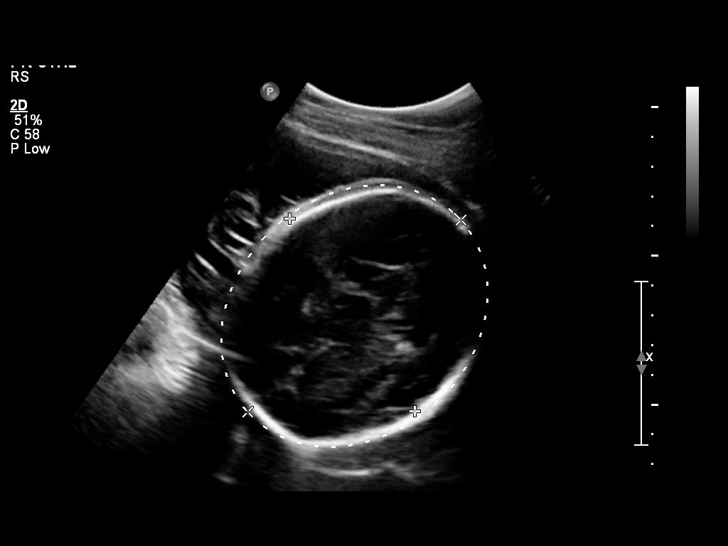
[im 7/55]
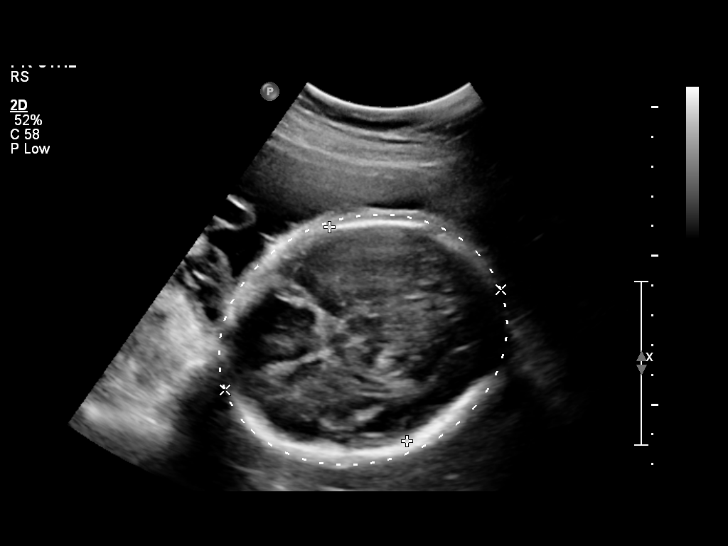
[im 11/55]
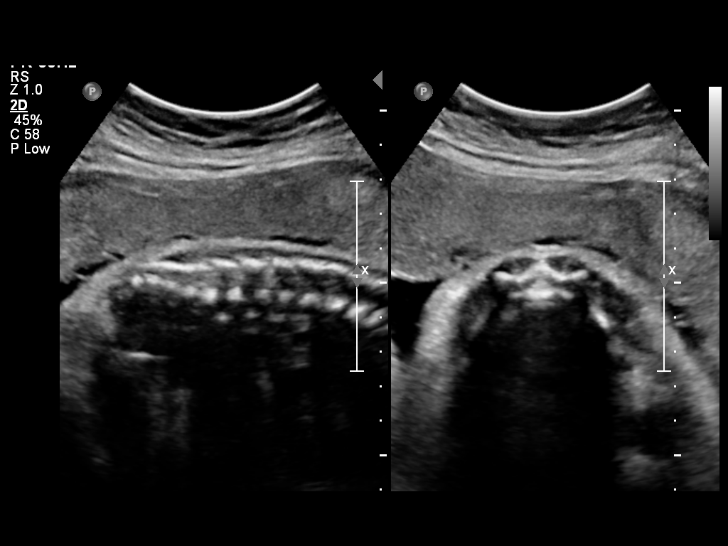
[im 15/55]
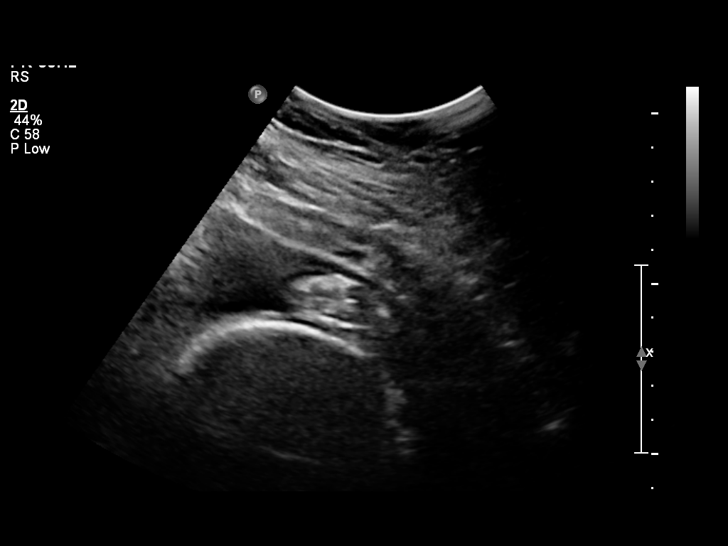
[im 19/55]
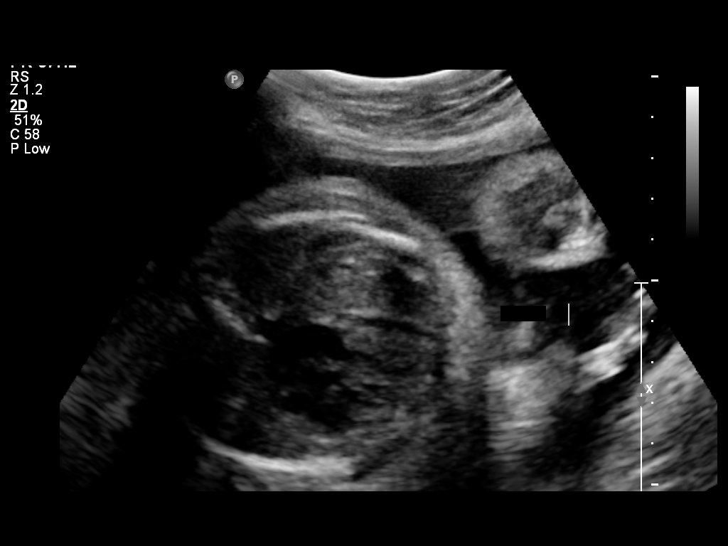
[im 23/55]
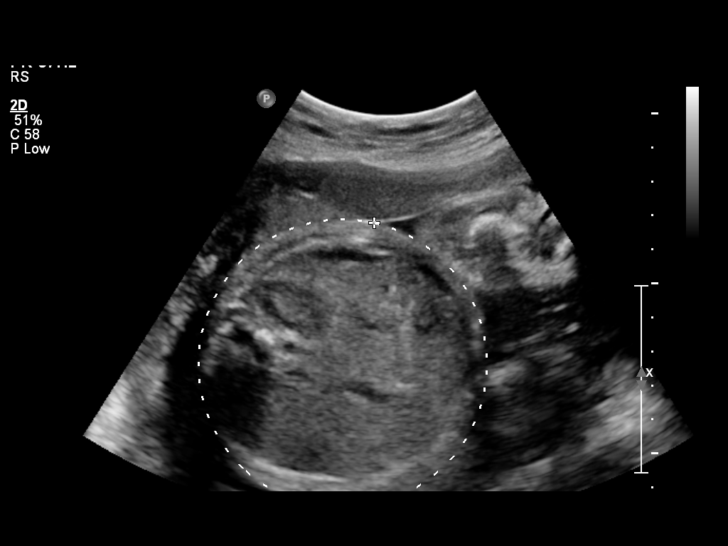
[im 27/55]
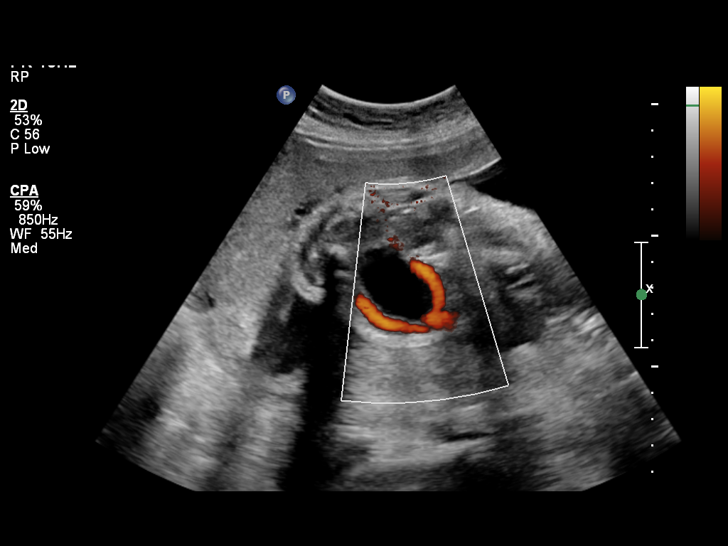
[im 31/55]
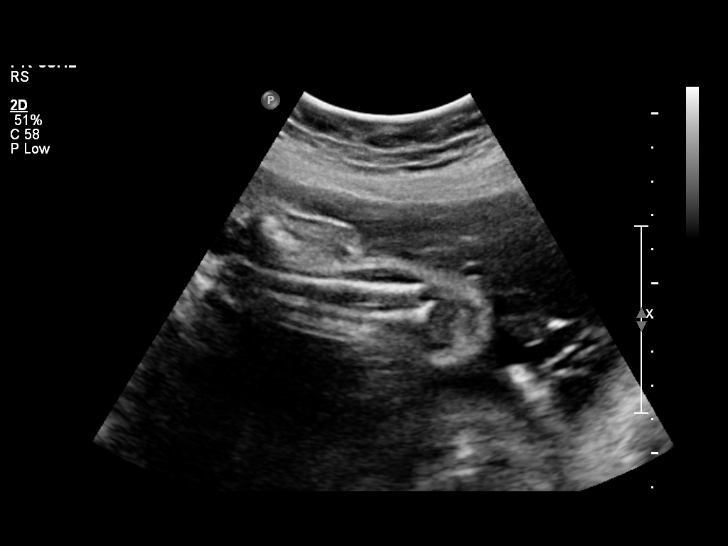
[im 35/55]
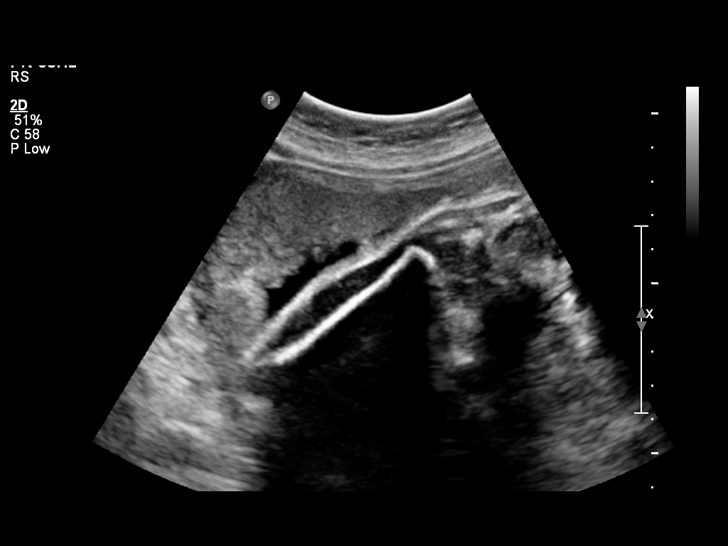
[im 39/55]
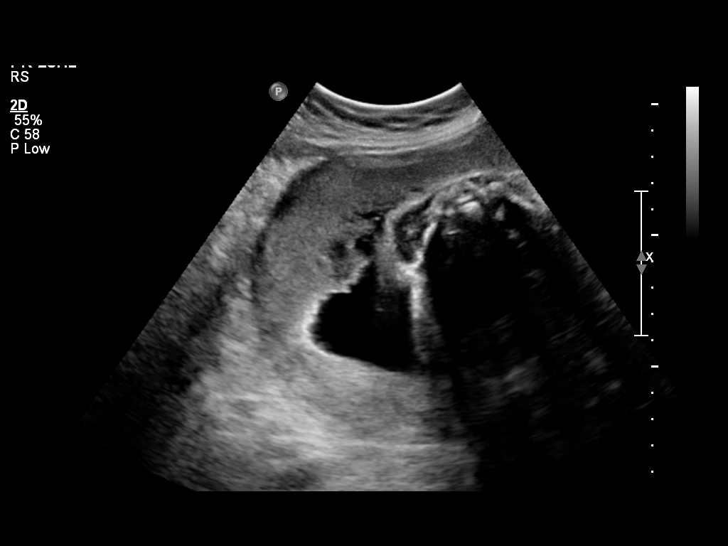
[im 43/55]
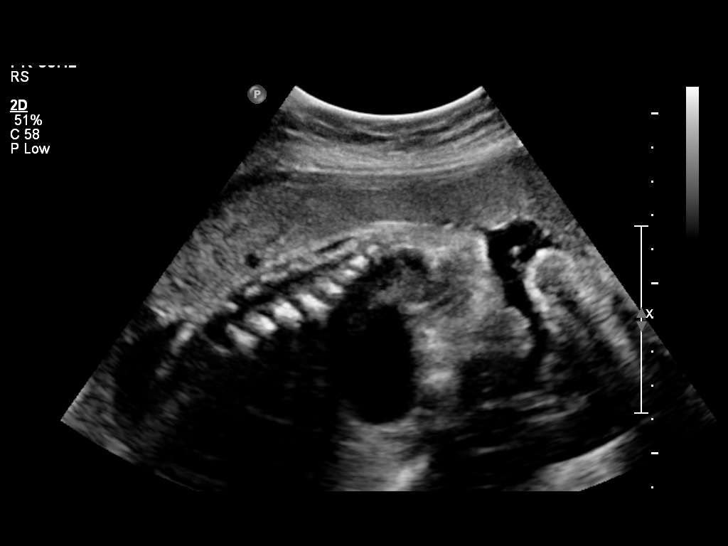
[im 47/55]
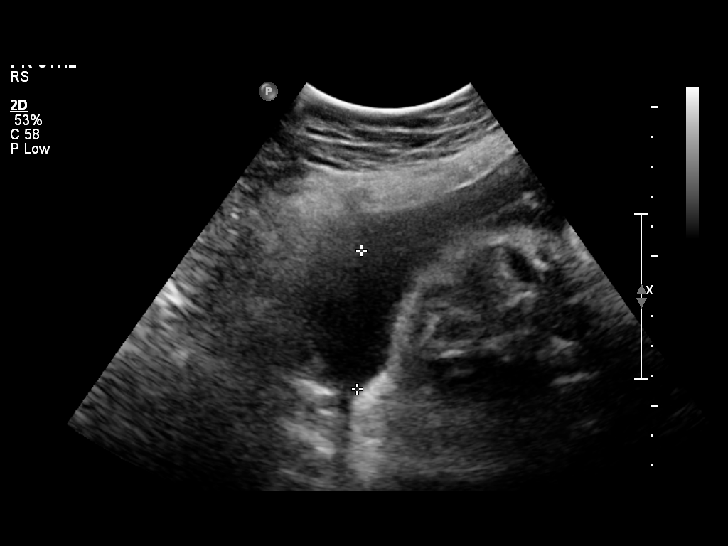
[im 51/55]
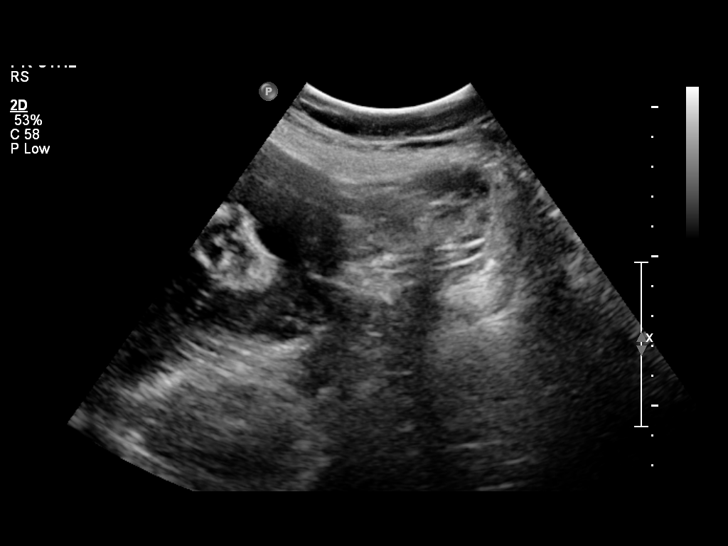
[im 55/55]
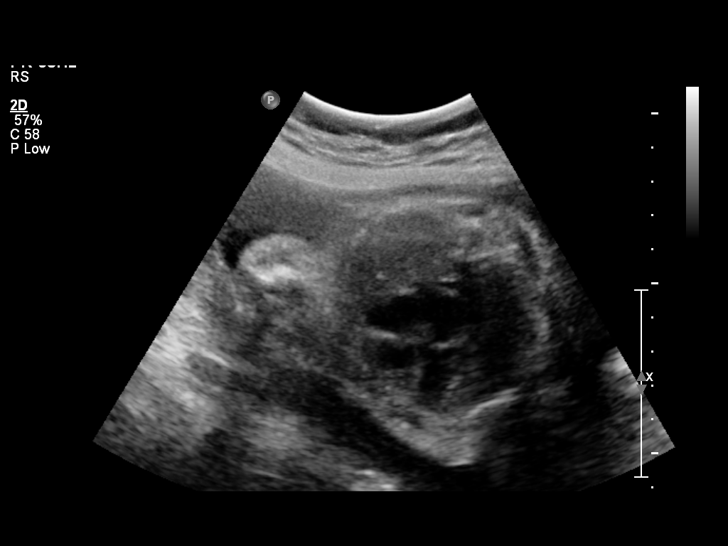

[14 of 28 positions shown; findings below may reference images not displayed]

IMPRESSION: See AS Obstetric US report.

## 2011-11-16 IMAGING — US US ABDOMEN COMPLETE
1 series · 14 of 25 positions shown · non-contrast
Comparison: Ultrasound April 2009.

CLINICAL DATA: Abdominal pain.  Pancreatitis, gallstones.  30
weeks pregnant.

COMPLETE ABDOMINAL ULTRASOUND

[Series 1: us abdomen complete · 0.25mm/px · 14 of 99 slices shown]
[im 1/99]
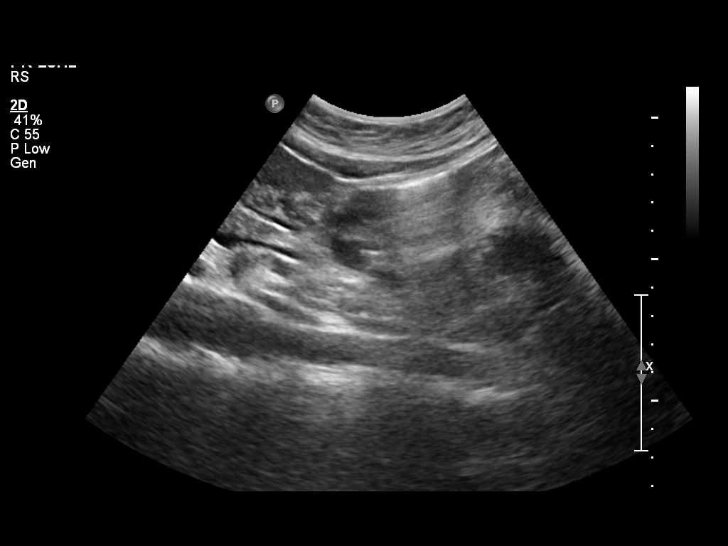
[im 9/99]
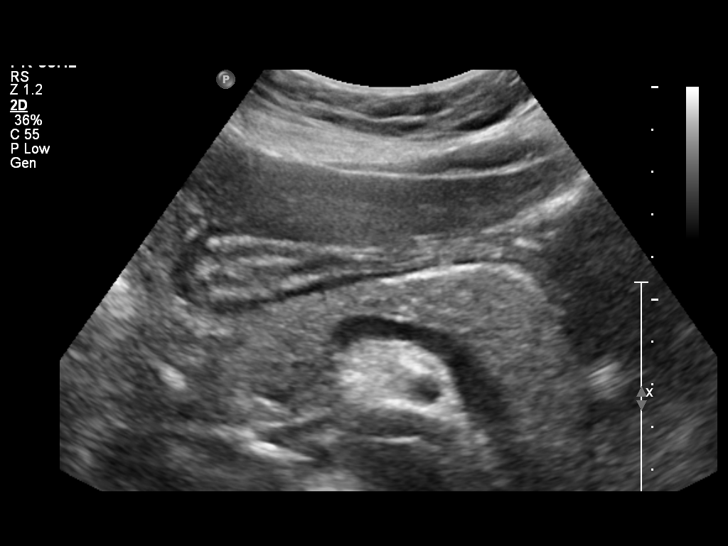
[im 17/99]
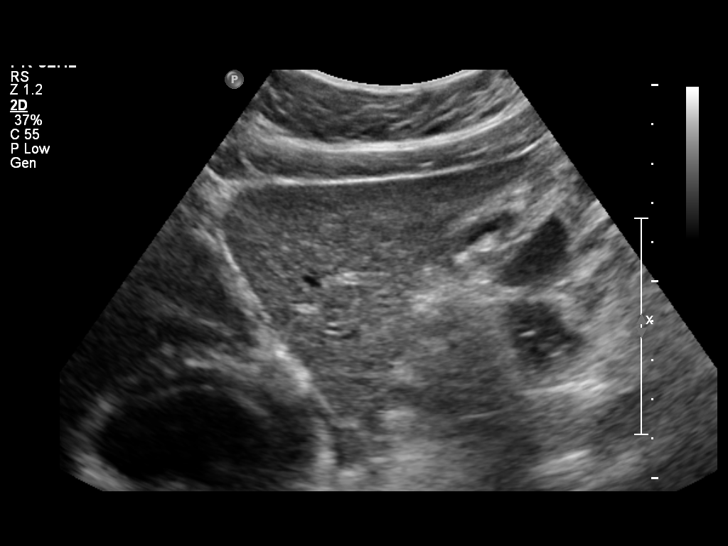
[im 25/99]
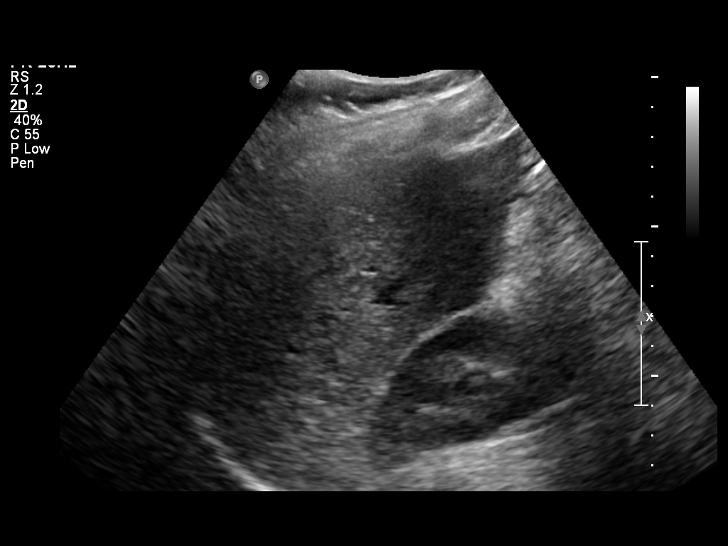
[im 33/99]
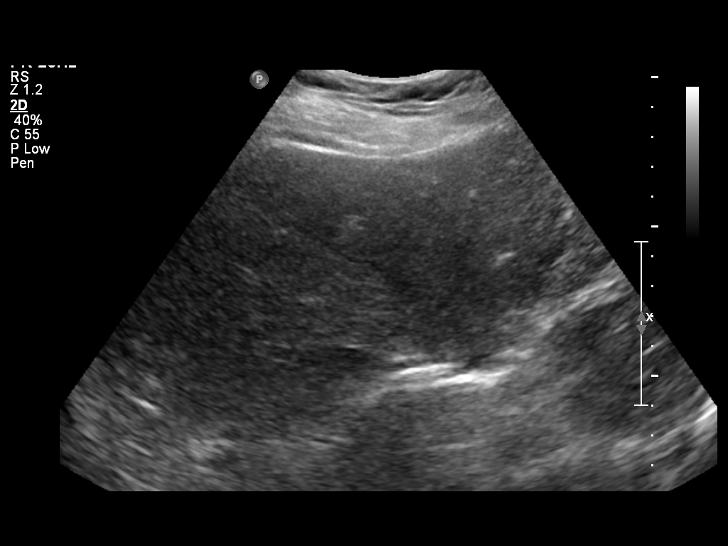
[im 37/99]
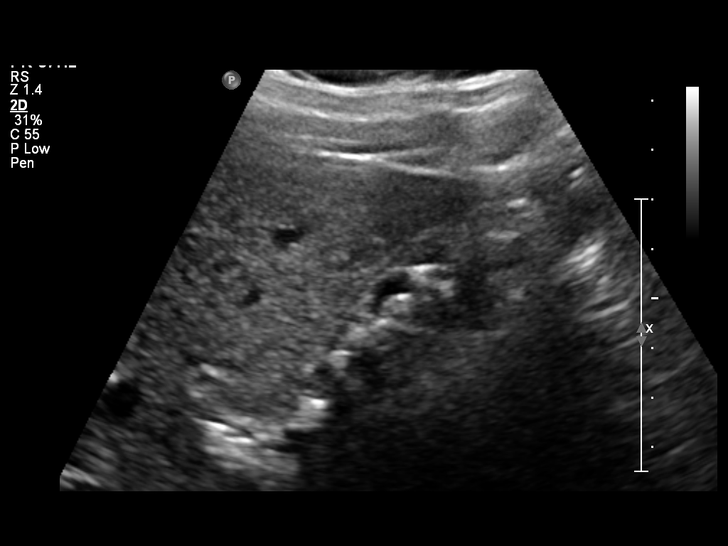
[im 45/99]
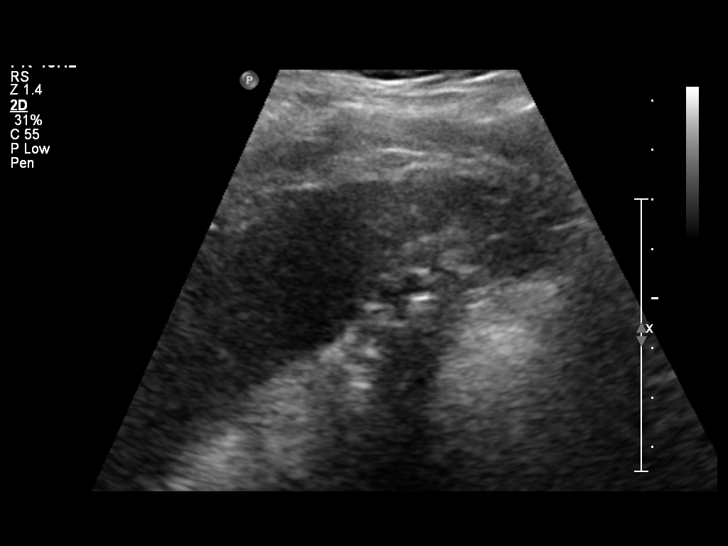
[im 54/99]
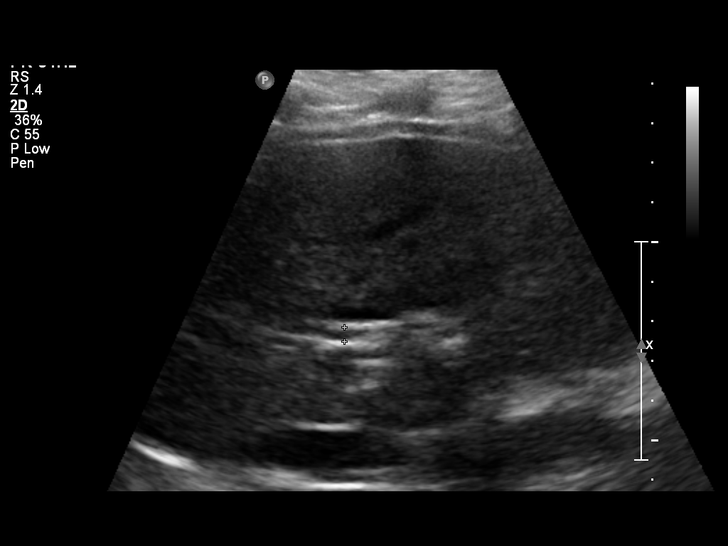
[im 62/99]
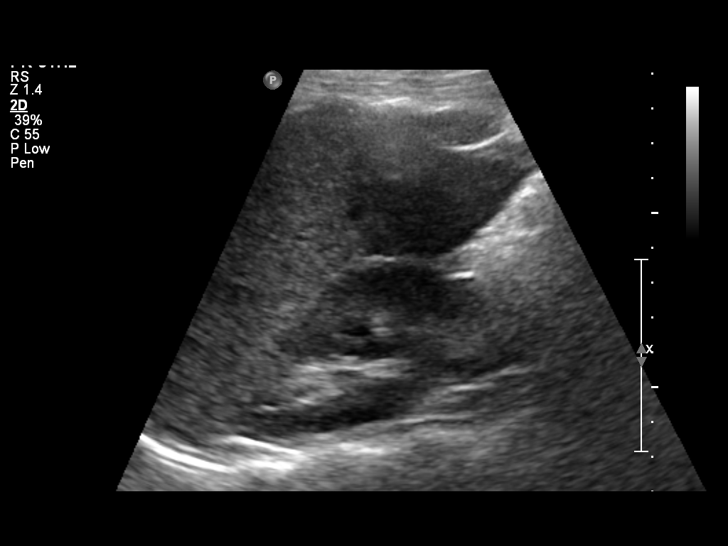
[im 66/99]
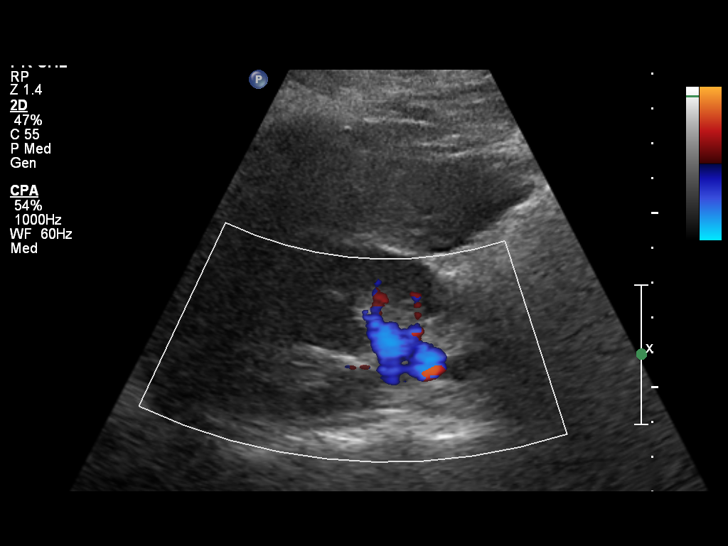
[im 74/99]
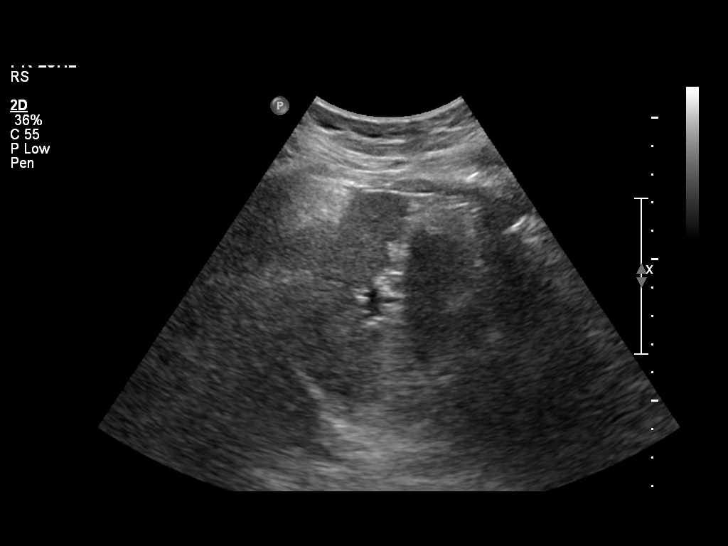
[im 82/99]
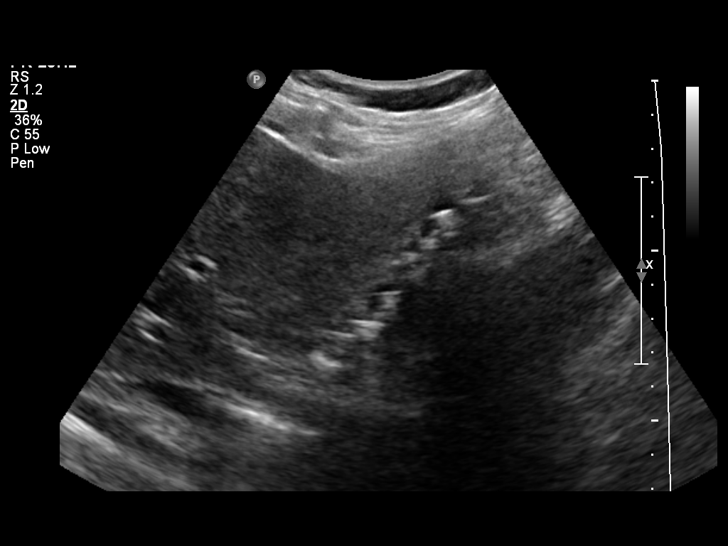
[im 90/99]
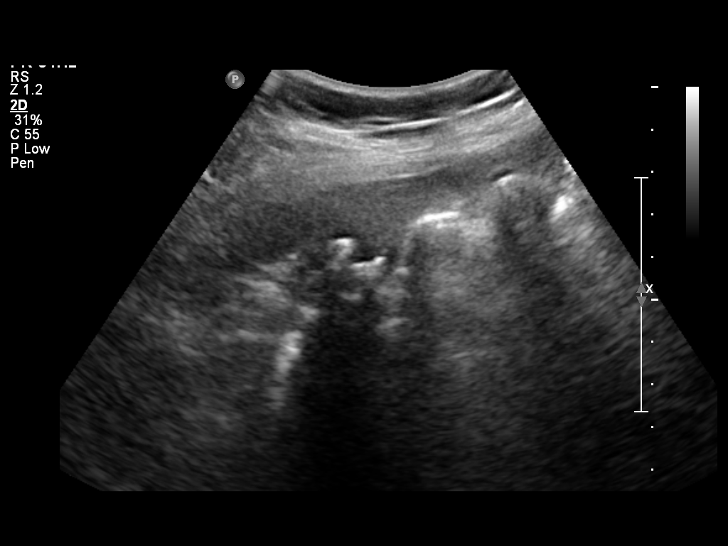
[im 99/99]
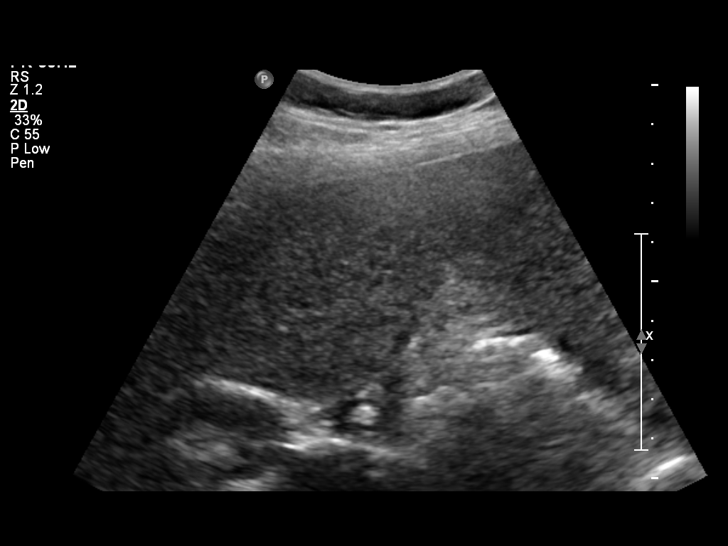

[14 of 25 positions shown; findings below may reference images not displayed]

FINDINGS: Gallbladder:  There are gallstones filling the gallbladder which is
contracted with mild wall thickening.

Common bile duct:   Mildly prominent proximally, up to 9 mm.
Tapers distally.  This is similar to prior study.

Liver:  No focal lesion identified.  Within normal limits in
parenchymal echogenicity.

IVC:  Appears normal.

Pancreas:  No focal abnormality seen.

Spleen:  Within normal limits in size and echotexture.

Right Kidney:   Normal in size and parenchymal echogenicity.  No
evidence of mass or hydronephrosis.

Left Kidney:  Normal in size and parenchymal echogenicity.  No
evidence of mass or hydronephrosis.

Abdominal aorta:  No aneurysm identified.
IMPRESSION: Contracted gallbladder filled with stones.

## 2012-03-10 ENCOUNTER — Inpatient Hospital Stay (HOSPITAL_COMMUNITY)
Admission: AD | Admit: 2012-03-10 | Discharge: 2012-03-10 | Disposition: A | Discharge status: Home / Self Care | Payer: 59 | Source: Ambulatory Visit | Attending: Obstetrics & Gynecology | Admitting: Obstetrics & Gynecology

## 2012-03-10 ENCOUNTER — Encounter (HOSPITAL_COMMUNITY): Payer: Self-pay | Admitting: Obstetrics and Gynecology

## 2012-03-10 DIAGNOSIS — N390 Urinary tract infection, site not specified: Secondary | ICD-10-CM

## 2012-03-10 DIAGNOSIS — O219 Vomiting of pregnancy, unspecified: Secondary | ICD-10-CM

## 2012-03-10 DIAGNOSIS — O2390 Unspecified genitourinary tract infection in pregnancy, unspecified trimester: Secondary | ICD-10-CM | POA: Insufficient documentation

## 2012-03-10 DIAGNOSIS — O210 Mild hyperemesis gravidarum: Secondary | ICD-10-CM | POA: Insufficient documentation

## 2012-03-10 HISTORY — DX: Personal history of other diseases of the digestive system: Z87.19

## 2012-03-10 LAB — URINALYSIS, ROUTINE W REFLEX MICROSCOPIC
Glucose, UA: NEGATIVE mg/dL
Leukocytes, UA: NEGATIVE
Nitrite: NEGATIVE
Protein, ur: 30 mg/dL — AB
pH: 6 (ref 5.0–8.0)

## 2012-03-10 LAB — URINE MICROSCOPIC-ADD ON

## 2012-03-10 LAB — POCT PREGNANCY, URINE: Preg Test, Ur: POSITIVE — AB

## 2012-03-10 MED ORDER — ONDANSETRON 8 MG PO TBDP: 4.0000 mg | ORAL_TABLET | Freq: Three times a day (TID) | ORAL | Status: AC | PRN

## 2012-03-10 MED ORDER — ONDANSETRON 8 MG PO TBDP
8.0000 mg | ORAL_TABLET | Freq: Once | ORAL | Status: AC
  Administered 2012-03-10: 8 mg via ORAL
  Filled 2012-03-10: qty 1

## 2012-03-10 MED ORDER — CEPHALEXIN 500 MG PO CAPS: 500.0000 mg | ORAL_CAPSULE | Freq: Three times a day (TID) | ORAL | Status: AC

## 2012-03-10 NOTE — MAU Note (Signed)
"  I just found out I was pregnant last Saturday.  I was a little nauseated before I found out, but now I'm not able to hold down anything...food or drink.  I threw up about 8 times yesterday.  I was high-risk with my last pregnancy.  I had gallstones and threw up the entire pregnancy with my last pregnancy.  I had hyperemesis with my second pregnancy."

## 2012-03-10 NOTE — MAU Provider Note (Signed)
History     CSN: [id number]  Arrival date and time: 03/10/12 0854   None     Chief Complaint  Patient presents with  . Emesis   HPI 31 y.o. at [redacted]w[redacted]d with n/v x 1 week, unable to keep anything down. Spotting last week, none since. No pain.    Past Medical History  Diagnosis Date  . Hx of gallstones     Past Surgical History  Procedure Date  . Cholecystectomy, laparoscopic 2012    Family History  Problem Relation Age of Onset  . Diabetes Maternal Grandmother   . Diabetes Maternal Grandfather   . Diabetes Paternal Grandmother   . Diabetes Paternal Grandfather     History  Substance Use Topics  . Smoking status: Never Smoker   . Smokeless tobacco: Not on file  . Alcohol Use: No    Allergies: No Known Allergies  No prescriptions prior to admission    Review of Systems  Constitutional: Negative.   Respiratory: Negative.   Cardiovascular: Negative.   Gastrointestinal: Positive for nausea and vomiting. Negative for abdominal pain, diarrhea and constipation.  Genitourinary: Negative for dysuria, urgency, frequency, hematuria and flank pain.       Negative for vaginal bleeding, vaginal discharge, dyspareunia  Musculoskeletal: Negative.   Neurological: Negative.   Psychiatric/Behavioral: Negative.    Physical Exam   Blood pressure 120/74, pulse 80, temperature 97.9 F (36.6 C), temperature source Oral, resp. rate 20, height 4\' 11"  (1.499 m), weight 192 lb 6.4 oz (87.272 kg), last menstrual period 01/23/2012.  Physical Exam  Nursing note and vitals reviewed. Constitutional: She is oriented to person, place, and time. She appears well-developed and well-nourished. No distress.  Cardiovascular: Normal rate.   Respiratory: Effort normal.  Musculoskeletal: Normal range of motion.  Neurological: She is alert and oriented to person, place, and time.  Skin: Skin is warm and dry.  Psychiatric: She has a normal mood and affect.    MAU Course  Procedures Results  for orders placed during the hospital encounter of 03/10/12 (from the past 24 hour(s))  URINALYSIS, ROUTINE W REFLEX MICROSCOPIC     Status: Abnormal   Collection Time   03/10/12  9:05 AM      Component Value Range   Color, Urine YELLOW  YELLOW   APPearance HAZY (*) CLEAR   Specific Gravity, Urine >1.030 (*) 1.005 - 1.030   pH 6.0  5.0 - 8.0   Glucose, UA NEGATIVE  NEGATIVE mg/dL   Hgb urine dipstick MODERATE (*) NEGATIVE   Bilirubin Urine SMALL (*) NEGATIVE   Ketones, ur 40 (*) NEGATIVE mg/dL   Protein, ur 30 (*) NEGATIVE mg/dL   Urobilinogen, UA 1.0  0.0 - 1.0 mg/dL   Nitrite NEGATIVE  NEGATIVE   Leukocytes, UA NEGATIVE  NEGATIVE  URINE MICROSCOPIC-ADD ON     Status: Abnormal   Collection Time   03/10/12  9:05 AM      Component Value Range   Squamous Epithelial / LPF MANY (*) RARE   WBC, UA 3-6  <3 WBC/hpf   RBC / HPF 3-6  <3 RBC/hpf   Bacteria, UA MANY (*) RARE  POCT PREGNANCY, URINE     Status: Abnormal   Collection Time   03/10/12  9:14 AM      Component Value Range   Preg Test, Ur POSITIVE (*) NEGATIVE   Able to tolerate PO fluids following Zofran ODT 8 MG  Assessment and Plan  31 y.o. [id number] at [redacted]w[redacted]d 1. Nausea and  vomiting in pregnancy   2. UTI (lower urinary tract infection)     Medication List  As of 03/10/2012 12:06 PM   START taking these medications         cephALEXin 500 MG capsule   Commonly known as: KEFLEX   Take 1 capsule (500 mg total) by mouth 3 (three) times daily.      ondansetron 8 MG disintegrating tablet   Commonly known as: ZOFRAN-ODT   Take 0.5-1 tablets (4-8 mg total) by mouth every 8 (eight) hours as needed for nausea.          Where to get your medications    These are the prescriptions that you need to pick up. We sent them to a specific pharmacy, so you will need to go there to get them.   [id number] - [place], [place] - [address] AT [facility] [address]    [address] [place] [address]    Phone:  [phone]        cephALEXin 500 MG capsule   ondansetron 8 MG disintegrating tablet           Urine sent for culture Start prenatal care asap - pregnancy verification letter given  Georges Mouse 03/10/2012, 12:06 PM

## 2012-03-10 NOTE — MAU Note (Signed)
Patient states she had a positive home pregnancy test one week ago and has been vomiting since that time. Has had slight spotting.

## 2012-03-10 NOTE — Progress Notes (Signed)
Pt states, "I remember taking this Zofran the last time and it didn't work.  It tastes nasty that it makes you want to throw up."

## 2012-03-12 LAB — URINE CULTURE: Colony Count: >=100000

## 2012-03-20 ENCOUNTER — Encounter (HOSPITAL_COMMUNITY): Payer: Self-pay

## 2012-03-20 ENCOUNTER — Inpatient Hospital Stay (HOSPITAL_COMMUNITY)
Admission: AD | Admit: 2012-03-20 | Discharge: 2012-03-20 | Disposition: A | Discharge status: Home / Self Care | Payer: 59 | Source: Ambulatory Visit | Attending: Obstetrics & Gynecology | Admitting: Obstetrics & Gynecology

## 2012-03-20 DIAGNOSIS — O210 Mild hyperemesis gravidarum: Secondary | ICD-10-CM | POA: Insufficient documentation

## 2012-03-20 DIAGNOSIS — O219 Vomiting of pregnancy, unspecified: Secondary | ICD-10-CM

## 2012-03-20 LAB — URINALYSIS, ROUTINE W REFLEX MICROSCOPIC
Bilirubin Urine: NEGATIVE
Ketones, ur: 40 mg/dL — AB
Leukocytes, UA: NEGATIVE
Nitrite: NEGATIVE
Protein, ur: NEGATIVE mg/dL
Urobilinogen, UA: 1 mg/dL (ref 0.0–1.0)
pH: 6 (ref 5.0–8.0)

## 2012-03-20 LAB — URINE MICROSCOPIC-ADD ON

## 2012-03-20 MED ORDER — PROMETHAZINE HCL 25 MG/ML IJ SOLN
25.0000 mg | Freq: Once | INTRAMUSCULAR | Status: AC
  Administered 2012-03-20: 25 mg via INTRAVENOUS
  Filled 2012-03-20: qty 1

## 2012-03-20 MED ORDER — PROMETHAZINE HCL 25 MG PO TABS: 25.0000 mg | ORAL_TABLET | Freq: Four times a day (QID) | ORAL | Status: DC | PRN

## 2012-03-20 MED ORDER — METOCLOPRAMIDE HCL 10 MG PO TABS: 10.0000 mg | ORAL_TABLET | Freq: Four times a day (QID) | ORAL | Status: DC

## 2012-03-20 MED ORDER — FAMOTIDINE 20 MG PO TABS: 20.0000 mg | ORAL_TABLET | Freq: Two times a day (BID) | ORAL | Status: DC

## 2012-03-20 MED ORDER — DEXTROSE 5 % IN LACTATED RINGERS IV BOLUS
1000.0000 mL | Freq: Once | INTRAVENOUS | Status: AC
  Administered 2012-03-20: 1000 mL via INTRAVENOUS

## 2012-03-20 MED ORDER — ONDANSETRON HCL 4 MG/2ML IJ SOLN
4.0000 mg | Freq: Once | INTRAMUSCULAR | Status: AC
  Administered 2012-03-20: 4 mg via INTRAVENOUS
  Filled 2012-03-20: qty 2

## 2012-03-20 MED ORDER — FAMOTIDINE IN NACL 20-0.9 MG/50ML-% IV SOLN
20.0000 mg | Freq: Once | INTRAVENOUS | Status: AC
  Administered 2012-03-20: 20 mg via INTRAVENOUS
  Filled 2012-03-20: qty 50

## 2012-03-20 NOTE — MAU Provider Note (Signed)
History     CSN: [id number]  Arrival date and time: 03/20/12 4098   First Provider Initiated Contact with Patient 03/20/12 [phone]      Chief Complaint  Patient presents with  . Emesis During Pregnancy   HPI 31 y.o. [id number] at [redacted]w[redacted]d with n/v x 2 weeks, zofran was working for a few days then stopped, hasn't tried it in a while.   Past Medical History  Diagnosis Date  . Hx of gallstones     Past Surgical History  Procedure Date  . Cholecystectomy, laparoscopic 2012    Family History  Problem Relation Age of Onset  . Diabetes Maternal Grandmother   . Diabetes Maternal Grandfather   . Diabetes Paternal Grandmother   . Diabetes Paternal Grandfather     History  Substance Use Topics  . Smoking status: Never Smoker   . Smokeless tobacco: Not on file  . Alcohol Use: No    Allergies: No Known Allergies  Prescriptions prior to admission  Medication Sig Dispense Refill  . cephALEXin (KEFLEX) 500 MG capsule Take 1 capsule (500 mg total) by mouth 3 (three) times daily.  21 capsule  0    Review of Systems  Constitutional: Negative.   Respiratory: Negative.   Cardiovascular: Negative.   Gastrointestinal: Positive for nausea and vomiting. Negative for abdominal pain, diarrhea and constipation.  Genitourinary: Negative for dysuria, urgency, frequency, hematuria and flank pain.       Negative for vaginal bleeding, vaginal discharge, dyspareunia  Musculoskeletal: Negative.   Neurological: Negative.   Psychiatric/Behavioral: Negative.    Physical Exam   Blood pressure 115/69, pulse 85, temperature 97.8 F (36.6 C), temperature source Oral, resp. rate 18, height 5' (1.524 m), weight 191 lb 3.2 oz (86.728 kg), last menstrual period 01/23/2012.  Physical Exam  Nursing note and vitals reviewed. Constitutional: She is oriented to person, place, and time. She appears well-developed and well-nourished. No distress.  Cardiovascular: Normal rate.   Respiratory: Effort normal.    GI: Soft. There is no tenderness.  Musculoskeletal: Normal range of motion.  Neurological: She is oriented to person, place, and time.  Skin: Skin is warm and dry.  Psychiatric: She has a normal mood and affect.    MAU Course  Procedures  Results for orders placed during the hospital encounter of 03/20/12 (from the past 24 hour(s))  URINALYSIS, ROUTINE W REFLEX MICROSCOPIC     Status: Abnormal   Collection Time   03/20/12  9:27 AM      Component Value Range   Color, Urine YELLOW  YELLOW   APPearance CLOUDY (*) CLEAR   Specific Gravity, Urine >1.030 (*) 1.005 - 1.030   pH 6.0  5.0 - 8.0   Glucose, UA NEGATIVE  NEGATIVE mg/dL   Hgb urine dipstick SMALL (*) NEGATIVE   Bilirubin Urine NEGATIVE  NEGATIVE   Ketones, ur 40 (*) NEGATIVE mg/dL   Protein, ur NEGATIVE  NEGATIVE mg/dL   Urobilinogen, UA 1.0  0.0 - 1.0 mg/dL   Nitrite NEGATIVE  NEGATIVE   Leukocytes, UA NEGATIVE  NEGATIVE  URINE MICROSCOPIC-ADD ON     Status: Abnormal   Collection Time   03/20/12  9:27 AM      Component Value Range   Squamous Epithelial / LPF MANY (*) RARE   WBC, UA 0-2  <3 WBC/hpf   RBC / HPF 0-2  <3 RBC/hpf   Bacteria, UA MANY (*) RARE   IV hydration with D5LR, feeling better with Phenergan 25 mg IV and  Pepcid 20 mg IV, but still nauseous after PO fluids, Zofran 4 mg IV given, pt tolerated fluids well  Assessment and Plan   1. Nausea and vomiting of pregnancy, antepartum    Medication List  As of 03/20/2012  4:01 PM   START taking these medications         famotidine 20 MG tablet   Commonly known as: PEPCID   Take 1 tablet (20 mg total) by mouth 2 (two) times daily.      metoCLOPramide 10 MG tablet   Commonly known as: REGLAN   Take 1 tablet (10 mg total) by mouth 4 (four) times daily.      promethazine 25 MG tablet   Commonly known as: PHENERGAN   Take 1 tablet (25 mg total) by mouth every 6 (six) hours as needed for nausea.         CONTINUE taking these medications          cephALEXin 500 MG capsule   Commonly known as: KEFLEX   Take 1 capsule (500 mg total) by mouth 3 (three) times daily.      ondansetron 8 MG disintegrating tablet   Commonly known as: ZOFRAN-ODT          Where to get your medications    These are the prescriptions that you need to pick up. We sent them to a specific pharmacy, so you will need to go there to get them.   [id number] - [place], [place] - [address] AT [facility] OF [address]    [address] [place] [address]    Phone: [phone]        famotidine 20 MG tablet   metoCLOPramide 10 MG tablet   promethazine 25 MG tablet           Follow-up Information    Follow up with [facility] [place]. (apply for medicaid/start prenatal care as soon as possible)    Contact information:   [address] [facility] [address]           Io Dieujuste 03/20/2012, 9:44 AM

## 2012-03-20 NOTE — MAU Note (Signed)
N/v for two weeks, has been taking Zofran helped the first few days, not helping.

## 2012-04-09 ENCOUNTER — Ambulatory Visit (INDEPENDENT_AMBULATORY_CARE_PROVIDER_SITE_OTHER): Payer: PRIVATE HEALTH INSURANCE | Admitting: Obstetrics and Gynecology

## 2012-04-09 DIAGNOSIS — O09299 Supervision of pregnancy with other poor reproductive or obstetric history, unspecified trimester: Secondary | ICD-10-CM | POA: Insufficient documentation

## 2012-04-09 DIAGNOSIS — IMO0002 Reserved for concepts with insufficient information to code with codable children: Secondary | ICD-10-CM | POA: Insufficient documentation

## 2012-04-09 DIAGNOSIS — Z331 Pregnant state, incidental: Secondary | ICD-10-CM

## 2012-04-09 NOTE — Progress Notes (Signed)
NOB int today.  Pt states no problems.  Prenatal panel with hgb electrophoresis, chem 9 and urine culture ordered.

## 2012-04-10 LAB — PRENATAL PANEL VII
Basophils Absolute: 0 10*3/uL (ref 0.0–0.1)
Basophils Relative: 0 % (ref 0–1)
Eosinophils Absolute: 0.5 10*3/uL (ref 0.0–0.7)
HIV: NONREACTIVE
Hepatitis B Surface Ag: NEGATIVE
MCH: 26.6 pg (ref 26.0–34.0)
MCHC: 34.4 g/dL (ref 30.0–36.0)
Neutro Abs: 4.2 10*3/uL (ref 1.7–7.7)
Neutrophils Relative %: 59 % (ref 43–77)
Platelets: 278 10*3/uL (ref 150–400)
RDW: 16.7 % — ABNORMAL HIGH (ref 11.5–15.5)

## 2012-04-11 ENCOUNTER — Encounter: Payer: Self-pay | Admitting: Obstetrics and Gynecology

## 2012-04-11 LAB — HEMOGLOBINOPATHY EVALUATION
Hemoglobin Other: 0 %
Hgb A2 Quant: 2.7 % (ref 2.2–3.2)
Hgb A: 97.3 % (ref 96.8–97.8)
Hgb F Quant: 0 % (ref 0.0–2.0)

## 2012-04-11 LAB — CULTURE, OB URINE: Colony Count: NO GROWTH

## 2012-04-23 ENCOUNTER — Ambulatory Visit (INDEPENDENT_AMBULATORY_CARE_PROVIDER_SITE_OTHER): Payer: PRIVATE HEALTH INSURANCE | Admitting: Obstetrics and Gynecology

## 2012-04-23 ENCOUNTER — Ambulatory Visit (INDEPENDENT_AMBULATORY_CARE_PROVIDER_SITE_OTHER): Payer: PRIVATE HEALTH INSURANCE

## 2012-04-23 VITALS — BP 104/64 | Wt 205.0 lb

## 2012-04-23 DIAGNOSIS — O169 Unspecified maternal hypertension, unspecified trimester: Secondary | ICD-10-CM

## 2012-04-23 DIAGNOSIS — Z331 Pregnant state, incidental: Secondary | ICD-10-CM

## 2012-04-23 DIAGNOSIS — Z3490 Encounter for supervision of normal pregnancy, unspecified, unspecified trimester: Secondary | ICD-10-CM

## 2012-04-23 DIAGNOSIS — O368390 Maternal care for abnormalities of the fetal heart rate or rhythm, unspecified trimester, not applicable or unspecified: Secondary | ICD-10-CM

## 2012-04-23 DIAGNOSIS — I10 Essential (primary) hypertension: Secondary | ICD-10-CM

## 2012-04-23 LAB — US OB LIMITED

## 2012-04-23 NOTE — Assessment & Plan Note (Signed)
No Residual Hypertension after this pregnancy

## 2012-04-23 NOTE — Progress Notes (Signed)
[redacted]w[redacted]d Subjective:    [name] is being seen today for her first obstetrical visit at [redacted]w[redacted]d gestation by LMP. EDD 10/20/12   She reports nausea with some vomiting early pregnancy. Has been taking Zofran 4mg  ODT Q 6hrly PRN and it has helped.  Her obstetrical history is significant for: Ob History- 3 F/T SVD. First pregnancy the patient had Pre E with no residual hypertension. Surgiacl Hx: Cholecystectomy 2012 Gyn: Had history of abnormal Pap smear in the past. Pap done today. Will need Early Glucola.   Relationship with FOB: involved and supportive.  Feeding plan:   Plans to breast feed. Had pumped and bottle fed in the past with her 2 y/o son.  Pregnancy history fully reviewed.    Review of Systems Pertinent ROS is described in HPI   Objective:   BP 104/64  Wt 205 lb (92.987 kg)  LMP 01/23/2012 Wt Readings from Last 1 Encounters:  04/23/12 205 lb (92.987 kg)   BMI: 39.2 Early Glucola indicated.  General: alert, cooperative and no distress Respiratory: clear to auscultation bilaterally Cardiovascular: regular rate and rhythm, S1, S2 normal, no murmur Breasts:  No dominant masses, nipples erect Gastrointestinal: soft, non-tender; no masses,  no organomegaly Extremities: extremities normal, no pain or edema Vaginal Bleeding: None  EXTERNAL GENITALIA: normal appearing vulva with no masses, tenderness or lesions VAGINA: no abnormal discharge or lesions CERVIX: no lesions or cervical motion tenderness; cervix closed, long, firm UTERUS: gravid and consistent with 13 weeks ADNEXA: no masses palpable and nontender OB EXAM PELVIMETRY: appears adequate   FHR:  162  bpm  Assessment:    Pregnancy at  [redacted]w[redacted]d  Plan:     Prenatal panel reviewed and discussed with the patient:yes Pap smear collected:yes GC/Chlamydia collected:yes Wet prep:  PH 4.0, normal Discussion of Genetic testing options:  Quad with Glucola at 18 weeks. Prenatal vitamins recommended- Will  take [employer] Vitamin 2 tabs daily Problem list reviewed and updated.  Plan of care: Follow up in 13 weeks for 4 week and Glucola at 18 weeks.   Earl Gala, CNM.

## 2012-04-23 NOTE — Progress Notes (Signed)
Nob work-up today last pap 2011 wnl . Pt stated no issues today

## 2012-04-25 LAB — PAP IG, CT-NG, RFX HPV ASCU
Chlamydia Probe Amp: NEGATIVE
GC Probe Amp: NEGATIVE

## 2012-04-30 ENCOUNTER — Telehealth: Payer: Self-pay

## 2012-04-30 NOTE — Telephone Encounter (Signed)
Spoke with pt informing her of abnormal pap and colposcopy is needed per DD. Informed pt since she is pregnant the provider may or may not take a biospy. Also informed pt nothing in vulva area 24 hrs prior to the procedure. ROB/Colpo scheduled 05/22/12 @ 11 am with AVS. Pt agrees and voices understanding.

## 2012-04-30 NOTE — Telephone Encounter (Signed)
Message copied by Janeece Agee on Mon Apr 30, 2012 11:11 AM ------      Message from: Earl Gala      Created: Fri Apr 27, 2012 10:38 PM      Regarding: Abnormal pap       This patient need a colpo as LGSIL      Can you given her a call to arrange same.      Thanks,      Geraldine Contras.      ----- Message -----         From: [facility] Interface         Sent: 04/24/2012   4:40 PM           To: Earl Gala, CNM

## 2012-05-22 ENCOUNTER — Other Ambulatory Visit: Payer: 59

## 2012-05-22 ENCOUNTER — Encounter: Payer: 59 | Admitting: Obstetrics and Gynecology

## 2012-05-22 ENCOUNTER — Ambulatory Visit (INDEPENDENT_AMBULATORY_CARE_PROVIDER_SITE_OTHER): Payer: PRIVATE HEALTH INSURANCE | Admitting: Obstetrics and Gynecology

## 2012-05-22 VITALS — BP 106/62 | Wt 205.0 lb

## 2012-05-22 DIAGNOSIS — Z331 Pregnant state, incidental: Secondary | ICD-10-CM

## 2012-05-22 DIAGNOSIS — Z3689 Encounter for other specified antenatal screening: Secondary | ICD-10-CM

## 2012-05-22 NOTE — Addendum Note (Signed)
Addended by: Tim Lair on: 05/22/2012 01:04 PM   Modules accepted: Orders

## 2012-05-22 NOTE — Progress Notes (Signed)
[redacted]w[redacted]d Pt here for colpo for LSIL/CIN 1. Pt has no c/o today. Early glucola due today.

## 2012-05-22 NOTE — Progress Notes (Signed)
[redacted]w[redacted]d See colposcopy note. Glucola today. Return to office in 3 weeks.  Anatomy ultrasound next visit.  COLPOSCOPY NOTE:  The colposcopy procedure was explained.  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 A speculum exam was performed.  The cervix was prepped with acetic acid and Hurricaine gel.  The cervix was evaluated using a white light and the green filter. Findings: white epithelium at 12:00, 6:00, and 5:00 .  The endocervical canal was clear.  No lesions were seen.  Biopsies obtained: no biopsies obtained because of pregnancy.   The procedure was terminated.  The patient tolerated her procedure well.   Dr. Stefano Gaul

## 2012-05-24 LAB — AFP, QUAD SCREEN
Curr Gest Age: 17.1 wks.days
Down Syndrome Scr Risk Est: <1:38500 {titer}
HCG, Total: 11836 m[IU]/mL
INH: 124.1 pg/mL
Interpretation-AFP: NEGATIVE
MoM for INH: 0.8
MoM for hCG: 0.67
Osb Risk: 1:11900 {titer}
Tri 18 Scr Risk Est: NEGATIVE
uE3 Mom: 1.04
uE3 Value: 0.7 ng/mL

## 2012-06-11 ENCOUNTER — Ambulatory Visit (INDEPENDENT_AMBULATORY_CARE_PROVIDER_SITE_OTHER): Payer: PRIVATE HEALTH INSURANCE | Admitting: Obstetrics and Gynecology

## 2012-06-11 ENCOUNTER — Ambulatory Visit (INDEPENDENT_AMBULATORY_CARE_PROVIDER_SITE_OTHER): Payer: PRIVATE HEALTH INSURANCE

## 2012-06-11 VITALS — BP 102/64 | Wt 205.0 lb

## 2012-06-11 DIAGNOSIS — Z3689 Encounter for other specified antenatal screening: Secondary | ICD-10-CM

## 2012-06-11 DIAGNOSIS — Z331 Pregnant state, incidental: Secondary | ICD-10-CM

## 2012-06-11 NOTE — Progress Notes (Signed)
[redacted]w[redacted]d Quad screen within normal limits. Ultrasound: Normal anatomy, normal fluid, 326 g (45th percentile, single gestation, The patient had preeclampsia with a previous pregnancy.  She has not had hypertension any other time in her life. Return to office in 4 weeks. Dr. Stefano Gaul

## 2012-06-11 NOTE — Progress Notes (Signed)
Pt stated no issues today.  Ultrasound shows:  SIUP  S=D     [place] EDD: 10/29/12            EFW: 11 oz           Placenta localization: fundal           Fetal presentation: vertex                   Anatomy survey is normal           Gender : female Comments:vertex presentation,fundal placenta (placental edge is 6.8cm from internal os- normal) fluid is normal(vertical pocket=5.5 cm ) No anomalies seen ( profile , nasal bone,palate,philtrum,open hands, 5th digit ,heel,feet seen,female gender , normal ovaries, no fluid in cds, normal adnexa's

## 2012-06-22 LAB — US OB COMP + 14 WK

## 2012-07-03 ENCOUNTER — Telehealth: Payer: Self-pay | Admitting: Obstetrics and Gynecology

## 2012-07-03 NOTE — Telephone Encounter (Signed)
Pt called, states this am after getting up noticed some popping between her legs when she walks.  Pt denies any severe cramping or extreme abdominal pain and denies any bldg.  Pt does reports +fm.  Pt advised to try to walk around as much as possible to exercise the area, take a warm bath or apply heat for comfort.  Pt has an appt on 07/09/12, it has any concerns to call after hours over the holidays or after we return to the office, pt voices agreement.

## 2012-07-09 ENCOUNTER — Ambulatory Visit (INDEPENDENT_AMBULATORY_CARE_PROVIDER_SITE_OTHER): Payer: 59 | Admitting: Obstetrics and Gynecology

## 2012-07-09 VITALS — BP 122/68 | Wt 210.0 lb

## 2012-07-09 DIAGNOSIS — O09299 Supervision of pregnancy with other poor reproductive or obstetric history, unspecified trimester: Secondary | ICD-10-CM

## 2012-07-09 NOTE — Progress Notes (Signed)
[redacted]w[redacted]d  No c/o 1 hr glu NV. Re-evaluate fundal ht to determine need for ultrasound

## 2012-07-11 NOTE — L&D Delivery Note (Signed)
Delivery Note At 5:04 PM a viable female, "[name]", was delivered via Vaginal, Spontaneous Delivery (Presentation: ; Occiput Anterior).  APGAR 8/9: , ; weight .   Placenta status: Spontaneous, intact .  Cord: 3 vessels with the following complications: Loose CAN x 1, delivered through, with cord around body and leg.  Cord pH: NA  Anesthesia: Epidural  Episiotomy: None Lacerations: None--"skid marks" at introitus Suture Repair: NA Est. Blood Loss (mL): 200 cc  Mom to postpartum.  Baby to skin to skin.  Nigel Bridgeman 11/07/2012, 5:39 PM

## 2012-08-06 ENCOUNTER — Encounter: Payer: 59 | Admitting: Obstetrics and Gynecology

## 2012-08-06 ENCOUNTER — Other Ambulatory Visit: Payer: 59

## 2012-08-31 ENCOUNTER — Telehealth: Payer: Self-pay | Admitting: Obstetrics and Gynecology

## 2012-09-03 ENCOUNTER — Ambulatory Visit: Payer: 59 | Admitting: Family Medicine

## 2012-09-03 VITALS — BP 116/62 | Wt 220.0 lb

## 2012-09-03 DIAGNOSIS — Z3490 Encounter for supervision of normal pregnancy, unspecified, unspecified trimester: Secondary | ICD-10-CM

## 2012-09-03 NOTE — Progress Notes (Signed)
[redacted]w[redacted]d Intermittent contractions, none today.  Concerned about fetal size. Glucola today. ROB in 2 weeks with ultrasound for size > dates. [name]

## 2012-09-03 NOTE — Progress Notes (Signed)
[redacted]w[redacted]d No complaints. 1 hr gtt today.

## 2012-09-04 LAB — RPR

## 2012-09-04 LAB — GLUCOSE TOLERANCE, 1 HOUR (50G) W/O FASTING: Glucose, 1 Hour GTT: 139 mg/dL (ref 70–140)

## 2012-09-05 LAB — HEMOGLOBINOPATHY EVALUATION
Hemoglobin Other: 0 %
Hgb A2 Quant: 2.8 % (ref 2.2–3.2)
Hgb A: 97.2 % (ref 96.8–97.8)
Hgb F Quant: 0 % (ref 0.0–2.0)
Hgb S Quant: 0 %

## 2012-09-17 ENCOUNTER — Other Ambulatory Visit: Payer: Self-pay | Admitting: Obstetrics and Gynecology

## 2012-09-17 ENCOUNTER — Ambulatory Visit: Payer: PRIVATE HEALTH INSURANCE | Admitting: Family Medicine

## 2012-09-17 ENCOUNTER — Other Ambulatory Visit: Payer: PRIVATE HEALTH INSURANCE

## 2012-09-17 VITALS — BP 100/64 | Wt 221.0 lb

## 2012-09-17 DIAGNOSIS — Z331 Pregnant state, incidental: Secondary | ICD-10-CM

## 2012-09-17 DIAGNOSIS — Z09 Encounter for follow-up examination after completed treatment for conditions other than malignant neoplasm: Secondary | ICD-10-CM

## 2012-09-17 LAB — US OB FOLLOW UP

## 2012-09-17 NOTE — Progress Notes (Signed)
[redacted]w[redacted]d Doing well, good fetal movement. Discussed U/S findings.  No questions. ROB in 2 weeks with GBS. L.Carter, FNP-BC

## 2012-09-17 NOTE — Progress Notes (Signed)
[redacted]w[redacted]d Ultrasound shows:  SIUP  S=D     [place] EDD:10/29/2012           AFI: 21.84 cm Normal 80th%           Cervical length: 3.59 cm           Placenta localization: fundal           Fetal presentation: Vertex                      Note: Singleton pregnancy, Normal [place], Cervix ix closed

## 2012-09-26 ENCOUNTER — Encounter (HOSPITAL_COMMUNITY): Payer: Self-pay

## 2012-09-26 ENCOUNTER — Inpatient Hospital Stay (HOSPITAL_COMMUNITY)
Admission: AD | Admit: 2012-09-26 | Discharge: 2012-09-26 | Disposition: A | Discharge status: Home / Self Care | Payer: PRIVATE HEALTH INSURANCE | Source: Ambulatory Visit | Attending: Obstetrics and Gynecology | Admitting: Obstetrics and Gynecology

## 2012-09-26 DIAGNOSIS — O212 Late vomiting of pregnancy: Secondary | ICD-10-CM | POA: Insufficient documentation

## 2012-09-26 DIAGNOSIS — O99891 Other specified diseases and conditions complicating pregnancy: Secondary | ICD-10-CM | POA: Insufficient documentation

## 2012-09-26 DIAGNOSIS — K529 Noninfective gastroenteritis and colitis, unspecified: Secondary | ICD-10-CM

## 2012-09-26 DIAGNOSIS — K5289 Other specified noninfective gastroenteritis and colitis: Secondary | ICD-10-CM | POA: Insufficient documentation

## 2012-09-26 LAB — URINALYSIS, ROUTINE W REFLEX MICROSCOPIC
Glucose, UA: NEGATIVE mg/dL
Ketones, ur: >80 mg/dL — AB
Leukocytes, UA: NEGATIVE
Protein, ur: 100 mg/dL — AB
Urobilinogen, UA: 1 mg/dL (ref 0.0–1.0)

## 2012-09-26 LAB — URINE MICROSCOPIC-ADD ON

## 2012-09-26 MED ORDER — DEXTROSE 5 % IN LACTATED RINGERS IV BOLUS
1000.0000 mL | Freq: Once | INTRAVENOUS | Status: AC
  Administered 2012-09-26: 1000 mL via INTRAVENOUS

## 2012-09-26 MED ORDER — PROMETHAZINE HCL 25 MG PO TABS: 25.0000 mg | ORAL_TABLET | Freq: Four times a day (QID) | ORAL | Status: DC | PRN

## 2012-09-26 MED ORDER — SODIUM CHLORIDE 0.9 % IV SOLN
25.0000 mg | Freq: Once | INTRAVENOUS | Status: AC
  Administered 2012-09-26: 25 mg via INTRAVENOUS
  Filled 2012-09-26: qty 1

## 2012-09-26 MED ORDER — PANTOPRAZOLE SODIUM 40 MG IV SOLR
40.0000 mg | Freq: Once | INTRAVENOUS | Status: AC
  Administered 2012-09-26: 40 mg via INTRAVENOUS
  Filled 2012-09-26: qty 40

## 2012-09-26 MED ORDER — ONDANSETRON 8 MG/NS 50 ML IVPB
8.0000 mg | Freq: Once | INTRAVENOUS | Status: AC
  Administered 2012-09-26: 8 mg via INTRAVENOUS
  Filled 2012-09-26: qty 8

## 2012-09-26 MED ORDER — LACTATED RINGERS IV SOLN: INTRAVENOUS | Status: DC

## 2012-09-26 MED ORDER — ONDANSETRON 8 MG PO TBDP: 8.0000 mg | ORAL_TABLET | Freq: Three times a day (TID) | ORAL | Status: DC | PRN

## 2012-09-26 NOTE — MAU Note (Signed)
Patient called her husband to pick her up, patient very drowsy from antiemetic await husbands arrival prior to discharge.

## 2012-09-26 NOTE — MAU Note (Signed)
Patient up to bathroom now has diarrhea notified H. Steelman CNM

## 2012-09-26 NOTE — MAU Note (Signed)
Vomiting since last night with upper abdominal pain, no diarrhea, history of gallbladder removal.

## 2012-09-26 NOTE — MAU Note (Signed)
Patient states she started having vomiting last night with multiple episodes. Having upper abdominal pain and pressure. Reports good fetal movement, no bleeding or leaking.

## 2012-10-01 ENCOUNTER — Other Ambulatory Visit: Payer: Self-pay | Admitting: Obstetrics and Gynecology

## 2012-10-24 ENCOUNTER — Telehealth (HOSPITAL_COMMUNITY): Payer: Self-pay | Admitting: *Deleted

## 2012-10-24 NOTE — Telephone Encounter (Signed)
Preadmission screen  

## 2012-10-30 ENCOUNTER — Encounter (HOSPITAL_COMMUNITY): Payer: Self-pay | Admitting: *Deleted

## 2012-11-01 ENCOUNTER — Telehealth (HOSPITAL_COMMUNITY): Payer: Self-pay | Admitting: *Deleted

## 2012-11-01 NOTE — Telephone Encounter (Signed)
Preadmission screen  

## 2012-11-06 ENCOUNTER — Inpatient Hospital Stay (HOSPITAL_COMMUNITY)
Admission: RE | Admit: 2012-11-06 | Discharge: 2012-11-09 | DRG: 775 | Disposition: A | Discharge status: Home / Self Care | Payer: Medicaid Other | Source: Ambulatory Visit | Attending: Obstetrics and Gynecology | Admitting: Obstetrics and Gynecology

## 2012-11-06 ENCOUNTER — Encounter (HOSPITAL_COMMUNITY): Payer: Self-pay

## 2012-11-06 DIAGNOSIS — E669 Obesity, unspecified: Secondary | ICD-10-CM

## 2012-11-06 DIAGNOSIS — O480 Post-term pregnancy: Principal | ICD-10-CM | POA: Diagnosis present

## 2012-11-06 HISTORY — DX: Obesity, unspecified: E66.9

## 2012-11-06 LAB — COMPREHENSIVE METABOLIC PANEL
ALT: 14 U/L (ref 0–35)
Albumin: 2.6 g/dL — ABNORMAL LOW (ref 3.5–5.2)
Alkaline Phosphatase: 146 U/L — ABNORMAL HIGH (ref 39–117)
Calcium: 9 mg/dL (ref 8.4–10.5)
GFR calc Af Amer: >90 mL/min (ref >90)
Glucose, Bld: 151 mg/dL — ABNORMAL HIGH (ref 70–99)
Potassium: 4.2 mEq/L (ref 3.5–5.1)
Sodium: 136 mEq/L (ref 135–145)
Total Protein: 6.6 g/dL (ref 6.0–8.3)

## 2012-11-06 LAB — LACTATE DEHYDROGENASE: LDH: 181 U/L (ref 94–250)

## 2012-11-06 LAB — CBC
HCT: 33.5 % — ABNORMAL LOW (ref 36.0–46.0)
MCHC: 32.2 g/dL (ref 30.0–36.0)
Platelets: 278 10*3/uL (ref 150–400)
RDW: 16.6 % — ABNORMAL HIGH (ref 11.5–15.5)
WBC: 10.2 10*3/uL (ref 4.0–10.5)

## 2012-11-06 LAB — TYPE AND SCREEN
ABO/RH(D): O POS
Antibody Screen: NEGATIVE

## 2012-11-06 MED ORDER — EPHEDRINE 5 MG/ML INJ
10.0000 mg | INTRAVENOUS | Status: DC | PRN
  Filled 2012-11-06: qty 2

## 2012-11-06 MED ORDER — EPHEDRINE 5 MG/ML INJ
10.0000 mg | INTRAVENOUS | Status: DC | PRN
  Filled 2012-11-06: qty 4
  Filled 2012-11-06: qty 2

## 2012-11-06 MED ORDER — PANTOPRAZOLE SODIUM 40 MG IV SOLR
40.0000 mg | Freq: Once | INTRAVENOUS | Status: DC
  Filled 2012-11-06: qty 40

## 2012-11-06 MED ORDER — PANTOPRAZOLE SODIUM 40 MG PO TBEC
40.0000 mg | DELAYED_RELEASE_TABLET | Freq: Once | ORAL | Status: DC
  Filled 2012-11-06: qty 1

## 2012-11-06 MED ORDER — SODIUM CHLORIDE 0.9 % IJ SOLN: 3.0000 mL | Freq: Two times a day (BID) | INTRAMUSCULAR | Status: DC

## 2012-11-06 MED ORDER — IBUPROFEN 600 MG PO TABS: 600.0000 mg | ORAL_TABLET | Freq: Four times a day (QID) | ORAL | Status: DC | PRN

## 2012-11-06 MED ORDER — OXYTOCIN BOLUS FROM INFUSION: 500.0000 mL | INTRAVENOUS | Status: DC

## 2012-11-06 MED ORDER — ONDANSETRON HCL 4 MG/2ML IJ SOLN: 4.0000 mg | Freq: Four times a day (QID) | INTRAMUSCULAR | Status: DC | PRN

## 2012-11-06 MED ORDER — MISOPROSTOL 25 MCG QUARTER TABLET: 25.0000 ug | ORAL_TABLET | ORAL | Status: DC | PRN

## 2012-11-06 MED ORDER — FENTANYL CITRATE 0.05 MG/ML IJ SOLN: 100.0000 ug | INTRAMUSCULAR | Status: DC | PRN

## 2012-11-06 MED ORDER — TERBUTALINE SULFATE 1 MG/ML IJ SOLN: 0.2500 mg | Freq: Once | INTRAMUSCULAR | Status: AC | PRN

## 2012-11-06 MED ORDER — LIDOCAINE HCL (PF) 1 % IJ SOLN
30.0000 mL | INTRAMUSCULAR | Status: DC | PRN
  Filled 2012-11-06: qty 30

## 2012-11-06 MED ORDER — OXYTOCIN 40 UNITS IN LACTATED RINGERS INFUSION - SIMPLE MED
1.0000 m[IU]/min | INTRAVENOUS | Status: DC
  Administered 2012-11-06: 1 m[IU]/min via INTRAVENOUS
  Administered 2012-11-07: 9 m[IU]/min via INTRAVENOUS
  Filled 2012-11-06: qty 1000

## 2012-11-06 MED ORDER — CITRIC ACID-SODIUM CITRATE 334-500 MG/5ML PO SOLN: 30.0000 mL | ORAL | Status: DC | PRN

## 2012-11-06 MED ORDER — PHENYLEPHRINE 40 MCG/ML (10ML) SYRINGE FOR IV PUSH (FOR BLOOD PRESSURE SUPPORT)
80.0000 ug | PREFILLED_SYRINGE | INTRAVENOUS | Status: DC | PRN
  Filled 2012-11-06: qty 2

## 2012-11-06 MED ORDER — PANTOPRAZOLE SODIUM 40 MG IV SOLR
40.0000 mg | Freq: Once | INTRAVENOUS | Status: AC
  Administered 2012-11-06: 40 mg via INTRAVENOUS
  Filled 2012-11-06: qty 40

## 2012-11-06 MED ORDER — SODIUM CHLORIDE 0.9 % IV SOLN: 250.0000 mL | INTRAVENOUS | Status: DC | PRN

## 2012-11-06 MED ORDER — LACTATED RINGERS IV SOLN
INTRAVENOUS | Status: DC
  Administered 2012-11-06 – 2012-11-07 (×3): via INTRAVENOUS

## 2012-11-06 MED ORDER — ACETAMINOPHEN 325 MG PO TABS: 650.0000 mg | ORAL_TABLET | ORAL | Status: DC | PRN

## 2012-11-06 MED ORDER — FENTANYL 2.5 MCG/ML BUPIVACAINE 1/10 % EPIDURAL INFUSION (WH - ANES)
14.0000 mL/h | INTRAMUSCULAR | Status: DC | PRN
  Administered 2012-11-07: 14 mL/h via EPIDURAL
  Administered 2012-11-07: 12 mL/h via EPIDURAL
  Filled 2012-11-06 (×2): qty 125

## 2012-11-06 MED ORDER — DIPHENHYDRAMINE HCL 50 MG/ML IJ SOLN: 25.0000 mg | Freq: Four times a day (QID) | INTRAMUSCULAR | Status: DC | PRN

## 2012-11-06 MED ORDER — PHENYLEPHRINE 40 MCG/ML (10ML) SYRINGE FOR IV PUSH (FOR BLOOD PRESSURE SUPPORT)
80.0000 ug | PREFILLED_SYRINGE | INTRAVENOUS | Status: DC | PRN
  Filled 2012-11-06: qty 5
  Filled 2012-11-06: qty 2

## 2012-11-06 MED ORDER — OXYTOCIN 40 UNITS IN LACTATED RINGERS INFUSION - SIMPLE MED: 1.0000 m[IU]/min | INTRAVENOUS | Status: DC

## 2012-11-06 MED ORDER — LACTATED RINGERS IV SOLN
500.0000 mL | Freq: Once | INTRAVENOUS | Status: AC
  Administered 2012-11-06: 500 mL via INTRAVENOUS

## 2012-11-06 MED ORDER — LACTATED RINGERS IV SOLN: 500.0000 mL | INTRAVENOUS | Status: DC | PRN

## 2012-11-06 MED ORDER — OXYCODONE-ACETAMINOPHEN 5-325 MG PO TABS: 1.0000 | ORAL_TABLET | ORAL | Status: DC | PRN

## 2012-11-06 MED ORDER — ZOLPIDEM TARTRATE 5 MG PO TABS
5.0000 mg | ORAL_TABLET | Freq: Every evening | ORAL | Status: DC | PRN
  Administered 2012-11-06: 5 mg via ORAL
  Filled 2012-11-06: qty 1

## 2012-11-06 MED ORDER — OXYTOCIN 40 UNITS IN LACTATED RINGERS INFUSION - SIMPLE MED: 62.5000 mL/h | INTRAVENOUS | Status: DC

## 2012-11-06 MED ORDER — SODIUM CHLORIDE 0.9 % IJ SOLN: 3.0000 mL | INTRAMUSCULAR | Status: DC | PRN

## 2012-11-06 MED ORDER — DIPHENHYDRAMINE HCL 50 MG/ML IJ SOLN: 12.5000 mg | INTRAMUSCULAR | Status: DC | PRN

## 2012-11-06 NOTE — H&P (Signed)
[name] is a 32 y.o.obese Black female presenting for post term IOL at [redacted]w[redacted]d.  Accompanied by her s.o., Everlean Alstrom, pt's mom, and pt's brother.   Denies UTI or PIH s/s.  Reports occ'l ctx.  No VB or LOF.  Cx closed last week at office appt.   Prenatal Course: Pt seen in MAU just before 7 weeks for n/v.  NOB interview at [facility] around 11 weeks, and NOB w/u at 13 weeks.  LSIL pap on 04/23/12 at NOB w/u, and colpo done at [redacted]w[redacted]d.  Had normal anatomy u/s around 20 weeks.  Quad screen neg.  Early gtt=99, and repeat 09/03/12=139.  Pt has had an uncomplicated pregnancy.  She had a lapse in care from 07/09/12 to 09/03/12 (24 weeks to 32 weeks).  At 32w visit, S>D, and u/s done at 34 w and S=D.  OB HX: G1=SVD '04 IOL for PreEclampsia [facility]); F=7+5 G2=SVD '08 at term; hyperemesis; in [place], [place], Female G3=SVD '11 at term; High Risk Clinic, [facility], Female IOL for gall stones G4=current  .Marland Kitchen Patient Active Problem List   Diagnosis Date Noted  . Hx of preeclampsia, prior pregnancy, currently pregnant   . Abnormal Pap smear    Maternal Medical History:  Contractions: Frequency: irregular.   Perceived severity is mild.    Fetal activity: Perceived fetal activity is normal.   Last perceived fetal movement was within the past hour.      OB History   Grav Para Term Preterm Abortions TAB SAB Ect Mult Living   4 3 3       3      Past Medical History  Diagnosis Date  . Hx of gallstones   . Abnormal Pap smear   . Hypertension     pre eclampsia first pregnancy   Past Surgical History  Procedure Laterality Date  . Cholecystectomy, laparoscopic  2012   Family History: family history includes Diabetes in her maternal grandfather and paternal grandmother. Social History:  reports that she has never smoked. She has never used smokeless tobacco. She reports that she does not drink alcohol or use illicit drugs.Pt is a SAHM.   Prenatal Transfer Tool  Maternal Diabetes: No Genetic Screening:  Normal Maternal Ultrasounds/Referrals: Normal Fetal Ultrasounds or other Referrals:  None Maternal Substance Abuse:  No Significant Maternal Medications:  Meds include: Protonix Other:  Significant Maternal Lab Results:  Lab values include: Group B Strep negative Other Comments:  Zyrtec for allergies; Quad screen neg;   Review of Systems  Constitutional: Negative.   HENT: Negative.   Eyes: Negative.   Respiratory: Negative.   Cardiovascular: Positive for leg swelling.  Gastrointestinal: Positive for heartburn.  Genitourinary: Negative.   Skin: Negative.   Neurological: Negative.     Dilation: 1.5 Effacement (%): 60;70 Exam by:: H Nabeeha Badertscher, CNM Blood pressure 135/87, pulse 93, temperature 98.1 F (36.7 C), temperature source Oral, resp. rate 20, last menstrual period 01/23/2012. Maternal Exam:  Uterine Assessment: Contraction strength is mild.  Contraction frequency is irregular.   Abdomen: Patient reports no abdominal tenderness. Fetal presentation: vertex  Introitus: Normal vulva. Pelvis: adequate for delivery.   Cervix: Cervix evaluated by digital exam.     Fetal Exam Fetal Monitor Review: Mode: ultrasound.   Baseline rate: 165.  Variability: moderate (6-25 bpm).   Pattern: accelerations present and late decelerations.    Fetal State Assessment: Category II - tracings are indeterminate.     Physical Exam  Constitutional: She is oriented to person, place, and time.  She appears well-developed and well-nourished. No distress.  HENT:  Head: Normocephalic and atraumatic.  Eyes: Pupils are equal, round, and reactive to light.  glasses  Cardiovascular: Normal rate.   Respiratory: Effort normal.  GI: Soft.  gravid  Genitourinary:  Cx: 1.5/60-70/-2 to -1; posterior, vtx  Musculoskeletal: She exhibits edema.  2+ BLE pitting edema BLE  Neurological: She is alert and oriented to person, place, and time. She has normal reflexes.  No clonus  Skin: Skin is warm and  dry.  Psychiatric: She has a normal mood and affect. Her behavior is normal. Judgment normal.    Prenatal labs: ABO, Rh: O/POS/-- (09/30 1142) Antibody: NEG (09/30 1142) Rubella: 56.9 (09/30 1142) RPR: NON REAC (02/24 1728)  HBsAg: NEGATIVE (09/30 1142)  HIV: NON REACTIVE (09/30 1142)  GBS: NEGATIVE (03/24 1714)  .Marland Kitchen Results for orders placed during the hospital encounter of 11/06/12 (from the past 24 hour(s))  CBC     Status: Abnormal   Collection Time    11/06/12  8:11 PM      Result Value Range   WBC 10.2  4.0 - 10.5 K/uL   RBC 4.31  3.87 - 5.11 MIL/uL   Hemoglobin 10.8 (*) 12.0 - 15.0 g/dL   HCT 82.9 (*) 56.2 - 13.0 %   MCV 77.7 (*) 78.0 - 100.0 fL   MCH 25.1 (*) 26.0 - 34.0 pg   MCHC 32.2  30.0 - 36.0 g/dL   RDW 86.5 (*) 78.4 - 69.6 %   Platelets 278  150 - 400 K/uL  COMPREHENSIVE METABOLIC PANEL     Status: Abnormal   Collection Time    11/06/12  8:11 PM      Result Value Range   Sodium 136  135 - 145 mEq/L   Potassium 4.2  3.5 - 5.1 mEq/L   Chloride 101  96 - 112 mEq/L   CO2 22  19 - 32 mEq/L   Glucose, Bld 151 (*) 70 - 99 mg/dL   BUN 6  6 - 23 mg/dL   Creatinine, Ser 2.95  0.50 - 1.10 mg/dL   Calcium 9.0  8.4 - 28.4 mg/dL   Total Protein 6.6  6.0 - 8.3 g/dL   Albumin 2.6 (*) 3.5 - 5.2 g/dL   AST 19  0 - 37 U/L   ALT 14  0 - 35 U/L   Alkaline Phosphatase 146 (*) 39 - 117 U/L   Total Bilirubin 0.2 (*) 0.3 - 1.2 mg/dL   GFR calc non Af Amer >90  >90 mL/min   GFR calc Af Amer >90  >90 mL/min  LACTATE DEHYDROGENASE     Status: None   Collection Time    11/06/12  8:11 PM      Result Value Range   LDH 181  94 - 250 U/L  URIC ACID     Status: None   Collection Time    11/06/12  8:11 PM      Result Value Range   Uric Acid, Serum 5.5  2.4 - 7.0 mg/dL  TYPE AND SCREEN     Status: None   Collection Time    11/06/12  8:18 PM      Result Value Range   ABO/RH(D) O POS     Antibody Screen NEG     Sample Expiration 11/09/2012     Assessment/Plan: 1. [redacted]w[redacted]d 2.  Post term 3. Cat II FHT 4. Bishop=6 5. GBS neg 6. Obese 7. Elevated glucose on CMET  1.  Admit to [facility] w/ Dr. Estanislado Pandy as attending 2. Routine L&D orders 3. Will eval FHT at present, and receiving IVFB now; one stable, will begin IV pitocin at 1mu and titrate by 25mu/hr overnight if FHT remains stable 4. Protonix IV x1 tonight for GERD; sleep aid prn 5. Desires epidural; may have once labor progress noted 6. C/w MD prn 7. Will check fasting cbg; pt did eat supper before admission  Cale Bethard H 11/06/2012, 8:44 PM

## 2012-11-06 NOTE — Progress Notes (Signed)
Patient ID: [name], female   DOB: 1980/08/14, 32 y.o.   MRN: [id number] FHT now Cat I w/ reactivity and baseline around 155.  Min to mod variability and no present decels.  Ctxs occ'l, about every 10 min. In light of initial FHT Cat II, d/c'd cytotec and ordered pitocin, but just had a discussion w/ pt and family and rec'd foley bulb as well.  Disc'd foley bulb's purpose and would hopefully help [place] use less overall Pitocin in induction process b/c would place mechanical pressure on cx.  R/b/a rev'd and after discussion, pt refused foley bulb, primarily stating that she feels it would be too uncomfortable.   BS u/s just done to confirm vtx presentation.  Will start pitocin at 1mu now and CTO closely.  C/w MD prn.    Rexene Edison, CNM 11/06/12 2215

## 2012-11-07 ENCOUNTER — Encounter (HOSPITAL_COMMUNITY): Payer: Self-pay

## 2012-11-07 ENCOUNTER — Inpatient Hospital Stay (HOSPITAL_COMMUNITY): Payer: Medicaid Other | Admitting: Anesthesiology

## 2012-11-07 ENCOUNTER — Encounter (HOSPITAL_COMMUNITY): Payer: Self-pay | Admitting: Anesthesiology

## 2012-11-07 LAB — GLUCOSE, CAPILLARY: Glucose-Capillary: 73 mg/dL (ref 70–99)

## 2012-11-07 MED ORDER — OXYCODONE-ACETAMINOPHEN 5-325 MG PO TABS
1.0000 | ORAL_TABLET | ORAL | Status: DC | PRN
  Administered 2012-11-07 – 2012-11-08 (×2): 1 via ORAL
  Administered 2012-11-08 (×3): 2 via ORAL
  Administered 2012-11-08 – 2012-11-09 (×3): 1 via ORAL
  Filled 2012-11-07 (×2): qty 1
  Filled 2012-11-07: qty 2
  Filled 2012-11-07 (×3): qty 1
  Filled 2012-11-07 (×2): qty 2

## 2012-11-07 MED ORDER — DIPHENHYDRAMINE HCL 25 MG PO CAPS: 25.0000 mg | ORAL_CAPSULE | Freq: Four times a day (QID) | ORAL | Status: DC | PRN

## 2012-11-07 MED ORDER — LIDOCAINE HCL (PF) 1 % IJ SOLN
INTRAMUSCULAR | Status: DC | PRN
  Administered 2012-11-07 (×3): 4 mL

## 2012-11-07 MED ORDER — ZOLPIDEM TARTRATE 5 MG PO TABS: 5.0000 mg | ORAL_TABLET | Freq: Every evening | ORAL | Status: DC | PRN

## 2012-11-07 MED ORDER — TETANUS-DIPHTH-ACELL PERTUSSIS 5-2.5-18.5 LF-MCG/0.5 IM SUSP
0.5000 mL | Freq: Once | INTRAMUSCULAR | Status: AC
  Administered 2012-11-08: 0.5 mL via INTRAMUSCULAR

## 2012-11-07 MED ORDER — WITCH HAZEL-GLYCERIN EX PADS: 1.0000 "application " | MEDICATED_PAD | CUTANEOUS | Status: DC | PRN

## 2012-11-07 MED ORDER — SIMETHICONE 80 MG PO CHEW: 80.0000 mg | CHEWABLE_TABLET | ORAL | Status: DC | PRN

## 2012-11-07 MED ORDER — PRENATAL MULTIVITAMIN CH
1.0000 | ORAL_TABLET | Freq: Every day | ORAL | Status: DC
  Administered 2012-11-08 – 2012-11-09 (×2): 1 via ORAL
  Filled 2012-11-07 (×2): qty 1

## 2012-11-07 MED ORDER — IBUPROFEN 600 MG PO TABS
600.0000 mg | ORAL_TABLET | Freq: Four times a day (QID) | ORAL | Status: DC
  Administered 2012-11-07 – 2012-11-09 (×8): 600 mg via ORAL
  Filled 2012-11-07 (×8): qty 1

## 2012-11-07 MED ORDER — ONDANSETRON HCL 4 MG/2ML IJ SOLN: 4.0000 mg | INTRAMUSCULAR | Status: DC | PRN

## 2012-11-07 MED ORDER — ONDANSETRON HCL 4 MG PO TABS: 4.0000 mg | ORAL_TABLET | ORAL | Status: DC | PRN

## 2012-11-07 MED ORDER — SENNOSIDES-DOCUSATE SODIUM 8.6-50 MG PO TABS
2.0000 | ORAL_TABLET | Freq: Every day | ORAL | Status: DC
  Administered 2012-11-08: 2 via ORAL

## 2012-11-07 MED ORDER — DIBUCAINE 1 % RE OINT: 1.0000 "application " | TOPICAL_OINTMENT | RECTAL | Status: DC | PRN

## 2012-11-07 MED ORDER — LANOLIN HYDROUS EX OINT: TOPICAL_OINTMENT | CUTANEOUS | Status: DC | PRN

## 2012-11-07 MED ORDER — LACTATED RINGERS IV SOLN
INTRAVENOUS | Status: DC
  Administered 2012-11-07: 15:00:00 via INTRAUTERINE

## 2012-11-07 MED ORDER — NALBUPHINE SYRINGE 5 MG/0.5 ML
10.0000 mg | INJECTION | INTRAMUSCULAR | Status: DC | PRN
  Administered 2012-11-07: 10 mg via INTRAVENOUS
  Filled 2012-11-07 (×2): qty 1

## 2012-11-07 MED ORDER — BENZOCAINE-MENTHOL 20-0.5 % EX AERO
1.0000 "application " | INHALATION_SPRAY | CUTANEOUS | Status: DC | PRN
  Filled 2012-11-07: qty 56

## 2012-11-07 NOTE — Progress Notes (Signed)
  Subjective: Comfortable with epidural.  Reports significant itching.  Objective: BP 138/79  Pulse 86  Temp(Src) 98.1 F (36.7 C) (Oral)  Resp 18  SpO2 99%  LMP 01/23/2012      FHT:  Category 1 UC:   irregular, every 2-5 minutes, 90 sec. SVE:  5-6, 80%, vtx, -1/-2, well-applied AROM--light MSF IUPC placed without resistance--initial one placed had blood return in channel.  Removed and another IUPC placed without difficulty.  No active bleeding. Pitocin on 13 mu/min.  Assessment / Plan: Progressive labor Light MSF Will continue augmentation.  [name] 11/07/2012, 1:18 PM

## 2012-11-07 NOTE — Progress Notes (Signed)
Subjective: Pt snoring; sleeping Lt lateral position.  Did take Ambien.  Pitocin on 2mu.  S.o., pt's mother, and pt's brother asleep at bs.    Objective: BP 107/65  Pulse 87  Temp(Src) 98.2 F (36.8 C) (Oral)  Resp 18  LMP 01/23/2012      FHT:  FHR: 155 bpm, variability: moderate,  accelerations:  Present,  decelerations:  Absent UC:   irregular, every 6-9 minutes SVE:   Dilation: 1.5 Effacement (%): 60;70 Station: -2 Exam by:: H Nickolus Wadding, CNM VE deferred Labs: Lab Results  Component Value Date   WBC 10.2 11/06/2012   HGB 10.8* 11/06/2012   HCT 33.5* 11/06/2012   MCV 77.7* 11/06/2012   PLT 278 11/06/2012    Assessment / Plan: 1. [redacted]w[redacted]d 2. IOL for postterm 3. GBS neg 4. obese 5. refused Foley bulb   Labor: latent Preeclampsia:  no signs or symptoms of toxicity Fetal Wellbeing:  Category I Pain Control:  Labor support without medications I/D:  n/a Anticipated MOD:  NSVD 1. Continue to titrate pitocin about 64mu/hr overnight 2. CTO FHT closely 3. C/w MD prn  Meira Wahba H 11/07/2012, 1:33 AM

## 2012-11-07 NOTE — Anesthesia Preprocedure Evaluation (Signed)
Anesthesia Evaluation  Patient identified by MRN, date of birth, ID band Patient awake    Reviewed: Allergy & Precautions, H&P , NPO status , Patient's Chart, lab work & pertinent test results, reviewed documented beta blocker date and time   History of Anesthesia Complications Negative for: history of anesthetic complications  Airway Mallampati: III TM Distance: >3 FB Neck ROM: full    Dental  (+) Teeth Intact   Pulmonary neg pulmonary ROS,  breath sounds clear to auscultation        Cardiovascular negative cardio ROS  Rhythm:regular Rate:Normal     Neuro/Psych negative neurological ROS  negative psych ROS   GI/Hepatic negative GI ROS, Neg liver ROS,   Endo/Other  Obese BMI 38.9  Renal/GU negative Renal ROS  negative genitourinary   Musculoskeletal   Abdominal   Peds  Hematology negative hematology ROS (+) anemia ,   Anesthesia Other Findings   Reproductive/Obstetrics (+) Pregnancy                           Anesthesia Physical Anesthesia Plan  ASA: II  Anesthesia Plan: Epidural   Post-op Pain Management:    Induction:   Airway Management Planned:   Additional Equipment:   Intra-op Plan:   Post-operative Plan:   Informed Consent: I have reviewed the patients History and Physical, chart, labs and discussed the procedure including the risks, benefits and alternatives for the proposed anesthesia with the patient or authorized representative who has indicated his/her understanding and acceptance.     Plan Discussed with:   Anesthesia Plan Comments:         Anesthesia Quick Evaluation

## 2012-11-07 NOTE — Anesthesia Procedure Notes (Signed)
Epidural Patient location during procedure: OB Start time: 11/07/2012 8:18 AM  Staffing Performed by: anesthesiologist   Preanesthetic Checklist Completed: patient identified, site marked, surgical consent, pre-op evaluation, timeout performed, IV checked, risks and benefits discussed and monitors and equipment checked  Epidural Patient position: sitting Prep: site prepped and draped and DuraPrep Patient monitoring: continuous pulse ox and blood pressure Approach: midline Injection technique: LOR air  Needle:  Needle type: Tuohy  Needle gauge: 17 G Needle length: 9 cm and 9 Needle insertion depth: 6 cm Catheter type: closed end flexible Catheter size: 19 Gauge Catheter at skin depth: 11 cm Test dose: negative  Assessment Events: blood not aspirated, injection not painful, no injection resistance, negative IV test and no paresthesia  Additional Notes Discussed risk of headache, infection, bleeding, nerve injury and failed or incomplete block.  Patient voices understanding and wishes to proceed.  Epidural placed easily on first attempt.  No paresthesia.  Patient tolerated procedure well with no apparent complications.  Jasmine December, MDReason for block:procedure for pain

## 2012-11-07 NOTE — Progress Notes (Signed)
  Subjective: Epidural just completed, getting comfortable.  Partner at bedside.  Objective: BP 140/86  Pulse 85  Temp(Src) 98.2 F (36.8 C) (Oral)  Resp 20  SpO2 99%  LMP 01/23/2012      FHT: Category 1, with cycles of reactivity. UC:   regular, every 5 minutes Pitocin on 7 mu/min.  Assessment / Plan: Induction of labor GBS negative Will check cervix when epidural fully functional. Anticipate AROM  Briarrose Shor 11/07/2012, 9:02 AM

## 2012-11-07 NOTE — Progress Notes (Signed)
  Subjective: Comfortable with epidural.  In to see patient due to series of late decels with patient lying on back.  Objective: BP 130/86  Pulse 84  Temp(Src) 98.1 F (36.7 C) (Oral)  Resp 20  SpO2 99%  LMP 01/23/2012      FHT:  Series of 3-4 late decels, with patient in Semi-Fowlers.  Resolved with position change to right lateral.  + scalp stimulation.   UC:   regular, every 2-3 minutes FSE applied. MVUs 195  SVE:   Dilation: 7 Effacement (%): 70 Station: -2 Exam by:: Manfred Arch CNM  Assessment / Plan: Progressive labor Category 2 FHR Will decrease pitocin and observe. Amnioinfusion  [name] 11/07/2012, 2:16 PM

## 2012-11-07 NOTE — Progress Notes (Signed)
  Subjective: Comfortable with epidural.  Objective: BP 119/67  Pulse 87  Temp(Src) 98.2 F (36.8 C) (Oral)  Resp 20  SpO2 99%  LMP 01/23/2012      FHT:  Category 1 UC:   irregular, every 4-7 minutes SVE:   Dilation: 3 Effacement (%): 70 Station: -2 Exam by:: v Tylene Quashie, cnm Vtx not well-applied AROM deferred until vtx descends further  Assessment / Plan: Induction of labor Early labor Continue induction, plan AROM as vtx descends. Foley placed.  Nigel Bridgeman 11/07/2012, 10:27 AM

## 2012-11-07 NOTE — Progress Notes (Signed)
  Subjective: Feeling contractions and some pressure.  Objective: BP 153/111  Pulse 83  Temp(Src) 97.9 F (36.6 C) (Oral)  Resp 20  SpO2 99%  LMP 01/23/2012   Total I/O In: -  Out: 975 [Urine:975]  FHT:  Reassuring, occasional mild variables with contractions. UC:   regular, every 3 minutes SVE:   Dilation: Lip/rim Effacement (%): 70 Station: 0 Exam by:: v lathaqm, cnm Pitocin on 8 mu/min MVUs 110 Likely OP No significant pressure to push, but able to move baby a little with pushing effort.  Assessment / Plan: Progressive labor Will await increased urge to push  [name] 11/07/2012, 3:45 PM

## 2012-11-07 NOTE — Progress Notes (Signed)
Subjective: Sleeping well.  Pitocin just increased to 4mu.  Family remains asleep at bs.    Objective: BP 111/55  Pulse 83  Temp(Src) 97.9 F (36.6 C) (Oral)  Resp 20  LMP 01/23/2012      FHT:  FHR: 135 bpm, variability: moderate,  accelerations:  Present,  decelerations:  Absent UC:   irregular, every 4-9 minutes SVE:   Dilation: 1.5 Effacement (%): 60;70 Station: -2 Exam by:: H Greidys Deland, CNM VE deferred at present Labs: Lab Results  Component Value Date   WBC 10.2 11/06/2012   HGB 10.8* 11/06/2012   HCT 33.5* 11/06/2012   MCV 77.7* 11/06/2012   PLT 278 11/06/2012    Assessment / Plan: 1. [redacted]w[redacted]d 2. IOL for post term 3. GBS neg 4. obese  Labor: Progressing on Pitocin, will continue to increase then AROM Preeclampsia:  no signs or symptoms of toxicity Fetal Wellbeing:  Category I Pain Control:  Labor support without medications I/D:  n/a Anticipated MOD:  NSVD 1. CTO for labor progression 2. Support as needed 3. C/w MD prn  Khloe Hunkele H 11/07/2012, 4:04 AM

## 2012-11-08 LAB — CBC
HCT: 30.1 % — ABNORMAL LOW (ref 36.0–46.0)
Hemoglobin: 9.5 g/dL — ABNORMAL LOW (ref 12.0–15.0)
RBC: 3.86 MIL/uL — ABNORMAL LOW (ref 3.87–5.11)
WBC: 10.3 10*3/uL (ref 4.0–10.5)

## 2012-11-08 NOTE — Progress Notes (Signed)
Post Partum Day 1: S/P SVD without repair  Subjective: Patient up ad lib, denies syncope or dizziness. Feeding:  Breastfeeding Contraceptive plan:   Unsure at this time  Objective: Blood pressure 114/69, pulse 79, temperature 97.9 F (36.6 C), temperature source Oral, resp. rate 20, height 4\' 11"  (1.499 m), weight 221 lb (100.245 kg), last menstrual period 01/23/2012, SpO2 96.00%, unknown if currently breastfeeding.  Physical Exam:  General: alert, cooperative and no distress Lochia: appropriate Uterine Fundus: firm Incision: healing well DVT Evaluation: No evidence of DVT seen on physical exam. Negative Homan's sign.   Recent Labs  11/06/12 2011 11/08/12 0555  HGB 10.8* 9.5*  HCT 33.5* 30.1*    Assessment/Plan: S/P Vaginal delivery day 1 Continue current care Plan for discharge tomorrow   LOS: 2 days   Daylyn Christine 11/08/2012, 9:02 AM

## 2012-11-08 NOTE — Progress Notes (Signed)
CSW consult received to assess "lapse in PNC 24-32 weeks," however drug screen were not ordered for this CSW to follow up. CSW reviewed pt's chart & did not note other CSW triggers to be addressed. Reconsult if other concerns arise.      

## 2012-11-08 NOTE — Anesthesia Postprocedure Evaluation (Signed)
  Anesthesia Post-op Note  Patient: [name]  Procedure(s) Performed: * No procedures listed *  Patient Location: Mother/Baby  Anesthesia Type:Epidural  Level of Consciousness: awake, alert , oriented and patient cooperative  Airway and Oxygen Therapy: Patient Spontanous Breathing  Post-op Pain: mild  Post-op Assessment: Patient's Cardiovascular Status Stable, Respiratory Function Stable, No headache and No backache, resolving numbess in left leg at thigh.  Told to contact anesthesia via nurse if not gone by morning.  Post-op Vital Signs: stable  Complications: No apparent anesthesia complications

## 2012-11-09 MED ORDER — IBUPROFEN 600 MG PO TABS: 600.0000 mg | ORAL_TABLET | Freq: Four times a day (QID) | ORAL | Status: DC | PRN

## 2012-11-09 MED ORDER — OXYCODONE-ACETAMINOPHEN 5-325 MG PO TABS: 1.0000 | ORAL_TABLET | Freq: Four times a day (QID) | ORAL | Status: DC | PRN

## 2012-11-09 NOTE — Discharge Summary (Signed)
  Vaginal Delivery Discharge Summary  [name]  DOB:    [birth year] MRN:    [id number] CSN:    [id number]  Date of admission:                  11/06/12  Date of discharge:                   11/09/12  Procedures this admission:  Date of Delivery: 11/07/12  Newborn Data:  Live born female  Birth Weight: 7 lb 9 oz (3430 g) APGAR: 8, 9  Home with mother. Name: [name] Circumcision Plan: NA  History of Present Illness:  [name] is a 32 y.o. female, [id number], who presents at [redacted]w[redacted]d weeks gestation. The patient has been followed at the [facility] division of [facility]. She was admitted induction of labor due to postdates. Her pregnancy has been complicated by:  Patient Active Problem List   Diagnosis Date Noted  . Vaginal delivery 11/07/2012  . Obese 11/06/2012  . Hx of preeclampsia, prior pregnancy, currently pregnant   . Abnormal Pap smear      Hospital course:  The patient was admitted for induction of labor on the evening of 11/06/12.   Her labor was not complicated. She proceeded to have a vaginal delivery of a healthy infant. Her delivery was not complicated. Her postpartum course was not complicated. She was discharged to home on postpartum day 2 doing well.  Feeding:  breast  Contraception:  Declines discussion at present.  Discharge hemoglobin:  Hemoglobin  Date Value Range Status  11/08/2012 9.5* 12.0 - 15.0 g/dL Final     HCT  Date Value Range Status  11/08/2012 30.1* 36.0 - 46.0 % Final    Discharge Physical Exam:   General: alert Lochia: appropriate Uterine Fundus: firm Incision: NA DVT Evaluation: No evidence of DVT seen on physical exam. Negative Homan's sign.  Intrapartum Procedures: spontaneous vaginal delivery Postpartum Procedures: none Complications-Operative and Postpartum: none  Discharge Diagnoses: Term Pregnancy-delivered  Discharge Information:  Activity:            Per [facility] handout Diet:                routine Medications: Ibuprofen and Percocet Condition:      stable Instructions:  refer to practice specific booklet Discharge to: home  Follow-up Information   Follow up with [facility]. Schedule an appointment as soon as possible for a visit in 6 weeks. (Call for any questions or concerns)    Contact information:   [address] [place] [address] [phone]       Nigel Bridgeman 11/09/2012

## 2014-05-12 ENCOUNTER — Encounter (HOSPITAL_COMMUNITY): Payer: Self-pay

## 2014-10-06 ENCOUNTER — Emergency Department (INDEPENDENT_AMBULATORY_CARE_PROVIDER_SITE_OTHER)
Admission: EM | Admit: 2014-10-06 | Discharge: 2014-10-06 | Disposition: A | Discharge status: Home / Self Care | Payer: PRIVATE HEALTH INSURANCE | Source: Home / Self Care | Attending: Emergency Medicine | Admitting: Emergency Medicine

## 2014-10-06 ENCOUNTER — Encounter (HOSPITAL_COMMUNITY): Payer: Self-pay | Admitting: Emergency Medicine

## 2014-10-06 DIAGNOSIS — J4531 Mild persistent asthma with (acute) exacerbation: Secondary | ICD-10-CM | POA: Diagnosis not present

## 2014-10-06 DIAGNOSIS — J301 Allergic rhinitis due to pollen: Secondary | ICD-10-CM

## 2014-10-06 MED ORDER — ALBUTEROL SULFATE (2.5 MG/3ML) 0.083% IN NEBU
INHALATION_SOLUTION | RESPIRATORY_TRACT | Status: AC
  Filled 2014-10-06: qty 3

## 2014-10-06 MED ORDER — IPRATROPIUM-ALBUTEROL 0.5-2.5 (3) MG/3ML IN SOLN
RESPIRATORY_TRACT | Status: AC
  Filled 2014-10-06: qty 3

## 2014-10-06 MED ORDER — ALBUTEROL SULFATE HFA 108 (90 BASE) MCG/ACT IN AERS: 2.0000 | INHALATION_SPRAY | RESPIRATORY_TRACT | Status: DC | PRN

## 2014-10-06 MED ORDER — TRIAMCINOLONE ACETONIDE 40 MG/ML IJ SUSP
40.0000 mg | Freq: Once | INTRAMUSCULAR | Status: AC
  Administered 2014-10-06: 40 mg via INTRAMUSCULAR

## 2014-10-06 MED ORDER — IPRATROPIUM BROMIDE 0.06 % NA SOLN: 2.0000 | Freq: Four times a day (QID) | NASAL | Status: DC

## 2014-10-06 MED ORDER — ALBUTEROL SULFATE (2.5 MG/3ML) 0.083% IN NEBU
2.5000 mg | INHALATION_SOLUTION | Freq: Once | RESPIRATORY_TRACT | Status: AC
  Administered 2014-10-06: 2.5 mg via RESPIRATORY_TRACT

## 2014-10-06 MED ORDER — IPRATROPIUM-ALBUTEROL 0.5-2.5 (3) MG/3ML IN SOLN
3.0000 mL | Freq: Once | RESPIRATORY_TRACT | Status: AC
  Administered 2014-10-06: 3 mL via RESPIRATORY_TRACT

## 2014-10-06 MED ORDER — FLUTICASONE PROPIONATE 50 MCG/ACT NA SUSP: 2.0000 | Freq: Every day | NASAL | Status: DC

## 2014-10-06 MED ORDER — TRIAMCINOLONE ACETONIDE 40 MG/ML IJ SUSP
INTRAMUSCULAR | Status: AC
  Filled 2014-10-06: qty 1

## 2014-10-06 MED ORDER — PREDNISONE 20 MG PO TABS: ORAL_TABLET | ORAL | Status: DC

## 2014-10-06 NOTE — ED Provider Notes (Signed)
CSN: [id number]     Arrival date & time 10/06/14  1149 History   First MD Initiated Contact with Patient 10/06/14 1409     Chief Complaint  Patient presents with  . Wheezing   (Consider location/radiation/quality/duration/timing/severity/associated sxs/prior Treatment) HPI Comments: 34 year old female complaining of a 4 day history of cough wheezing, runny nose, PND and watery eyes. He denies or history say she is never smoked she says she has a history of smoking maybe 5 cigarettes a day.   Past Medical History  Diagnosis Date  . Hx of gallstones   . Abnormal Pap smear   . Hypertension     pre eclampsia first pregnancy  . Obese 11/06/2012   Past Surgical History  Procedure Laterality Date  . Cholecystectomy, laparoscopic  2012   Family History  Problem Relation Age of Onset  . Diabetes Maternal Grandfather   . Diabetes Paternal Grandmother    History  Substance Use Topics  . Smoking status: Never Smoker   . Smokeless tobacco: Never Used  . Alcohol Use: No   OB History    Gravida Para Term Preterm AB TAB SAB Ectopic Multiple Living   4 4 4       4      Review of Systems  Constitutional: Positive for activity change. Negative for fever.  HENT: Positive for congestion, postnasal drip, rhinorrhea and sneezing. Negative for ear pain.   Eyes: Positive for discharge. Negative for photophobia and visual disturbance.       Clear watery drainage  Respiratory: Positive for cough and wheezing.   Cardiovascular: Negative.   Gastrointestinal: Negative.     Allergies  Dust mite extract and Pollen extract  Home Medications   Prior to Admission medications   Medication Sig Start Date End Date Taking? Authorizing Provider  albuterol (PROVENTIL HFA;VENTOLIN HFA) 108 (90 BASE) MCG/ACT inhaler Inhale 2 puffs into the lungs every 4 (four) hours as needed for wheezing or shortness of breath. 10/06/14   Hayden Rasmussenavid Val Schiavo, NP  flintstones complete (FLINTSTONES) 60 MG chewable tablet Chew 2  tablets by mouth daily.    Historical Provider, MD  fluticasone (FLONASE) 50 MCG/ACT nasal spray Place 2 sprays into both nostrils daily. 10/06/14   Hayden Rasmussenavid Rickard Kennerly, NP  ibuprofen (ADVIL,MOTRIN) 600 MG tablet Take 1 tablet (600 mg total) by mouth every 6 (six) hours as needed for pain. 11/09/12   Nigel BridgemanVicki Latham, CNM  ipratropium (ATROVENT) 0.06 % nasal spray Place 2 sprays into both nostrils 4 (four) times daily. 10/06/14   Hayden Rasmussenavid Dondi Aime, NP  mineral oil-hydrophilic petrolatum (AQUAPHOR) ointment Apply 1 application topically daily as needed (For excema.).    Historical Provider, MD  oxyCODONE-acetaminophen (PERCOCET/ROXICET) 5-325 MG per tablet Take 1-2 tablets by mouth every 6 (six) hours as needed. 11/09/12   Nigel BridgemanVicki Latham, CNM  predniSONE (DELTASONE) 20 MG tablet Take 3 tabs po on first day, 2 tabs second day, 2 tabs third day, 1 tab fourth day, 1 tab 5th day. Take with food. Start 10/07/14. 10/06/14   Hayden Rasmussenavid Reagann Dolce, NP   BP 145/88 mmHg  Pulse 83  Temp(Src) 98.8 F (37.1 C) (Oral)  Resp 20  SpO2 95%  LMP 08/18/2014 Physical Exam  Constitutional: She is oriented to person, place, and time. She appears well-developed and well-nourished. No distress.  HENT:  Mouth/Throat: No oropharyngeal exudate.  Left TM partially occluded with wax otherwise pearly gray and without erythema. Right TM occluded with cerumen and unable to visualize. Oropharynx with minor erythema and moderate amount of clear  PND.  Eyes: Conjunctivae and EOM are normal.  Neck: Normal range of motion. Neck supple.  Cardiovascular: Normal rate, regular rhythm and normal heart sounds.   Pulmonary/Chest: Effort normal. No respiratory distress.  Diminished breath sounds bilaterally with coarseness and occasional wheeze.  Lymphadenopathy:    She has no cervical adenopathy.  Neurological: She is alert and oriented to person, place, and time. She exhibits normal muscle tone.  Skin: Skin is warm and dry.  Psychiatric: She has a normal mood and affect.   Nursing note and vitals reviewed.   ED Course  Procedures (including critical care time) Labs Review Labs Reviewed - No data to display  Imaging Review No results found.   MDM   1. Allergic rhinitis due to pollen   2. RAD (reactive airway disease) with wheezing, mild persistent, with acute exacerbation    Patient received a DuoNeb 5 mg/2.5 mg. Post-neb evaluation reveals much improved air movement and a few remaining wheezes are louder. Cough is decreased. Change to allegra 180 mg a day for drainage For congestion Sudafed PE 10 mg Fluticasone nasal spray as directed. Atrovent nasal spray as directed Prednisone taper dose and albuterol HFA for cough and wheeze Stop smoking     Hayden Rasmussen, NP 10/06/14 1533

## 2014-10-06 NOTE — Discharge Instructions (Signed)
Allergic Rhinitis Change to allegra 180 mg a day for drainage For congestion Sudafed PE 10 mg Fluticasone nasal spray as directed. Atrovent nasal spray as directed Prednisone taper dose and albuterol HFA for cough and wheeze  Allergic rhinitis is when the mucous membranes in the nose respond to allergens. Allergens are particles in the air that cause your body to have an allergic reaction. This causes you to release allergic antibodies. Through a chain of events, these eventually cause you to release histamine into the blood stream. Although meant to protect the body, it is this release of histamine that causes your discomfort, such as frequent sneezing, congestion, and an itchy, runny nose.  CAUSES  Seasonal allergic rhinitis (hay fever) is caused by pollen allergens that may come from grasses, trees, and weeds. Year-round allergic rhinitis (perennial allergic rhinitis) is caused by allergens such as house dust mites, pet dander, and mold spores.  SYMPTOMS   Nasal stuffiness (congestion).  Itchy, runny nose with sneezing and tearing of the eyes. DIAGNOSIS  Your health care provider can help you determine the allergen or allergens that trigger your symptoms. If you and your health care provider are unable to determine the allergen, skin or blood testing may be used. TREATMENT  Allergic rhinitis does not have a cure, but it can be controlled by:  Medicines and allergy shots (immunotherapy).  Avoiding the allergen. Hay fever may often be treated with antihistamines in pill or nasal spray forms. Antihistamines block the effects of histamine. There are over-the-counter medicines that may help with nasal congestion and swelling around the eyes. Check with your health care provider before taking or giving this medicine.  If avoiding the allergen or the medicine prescribed do not work, there are many new medicines your health care provider can prescribe. Stronger medicine may be used if initial  measures are ineffective. Desensitizing injections can be used if medicine and avoidance does not work. Desensitization is when a patient is given ongoing shots until the body becomes less sensitive to the allergen. Make sure you follow up with your health care provider if problems continue. HOME CARE INSTRUCTIONS It is not possible to completely avoid allergens, but you can reduce your symptoms by taking steps to limit your exposure to them. It helps to know exactly what you are allergic to so that you can avoid your specific triggers. SEEK MEDICAL CARE IF:   You have a fever.  You develop a cough that does not stop easily (persistent).  You have shortness of breath.  You start wheezing.  Symptoms interfere with normal daily activities. Document Released: 03/22/2001 Document Revised: 07/02/2013 Document Reviewed: 03/04/2013 [facility] Patient Information 2015 [employer] [place]. This information is not intended to replace advice given to you by your health care provider. Make sure you discuss any questions you have with your health care provider.

## 2014-10-06 NOTE — ED Notes (Signed)
Reports wheezing, difficulty breathing, coughing, onset 4 days ago.  Denies history of asthma

## 2016-01-19 ENCOUNTER — Ambulatory Visit (INDEPENDENT_AMBULATORY_CARE_PROVIDER_SITE_OTHER): Payer: 59 | Admitting: Physician Assistant

## 2016-01-19 VITALS — BP 118/76 | HR 76 | Temp 98.1°F | Resp 16 | Ht 61.0 in | Wt 189.0 lb

## 2016-01-19 DIAGNOSIS — R070 Pain in throat: Secondary | ICD-10-CM | POA: Diagnosis not present

## 2016-01-19 DIAGNOSIS — Z91018 Allergy to other foods: Secondary | ICD-10-CM | POA: Diagnosis not present

## 2016-01-19 LAB — GLUCOSE, POCT (MANUAL RESULT ENTRY): POC Glucose: 85 mg/dl (ref 70–99)

## 2016-01-19 MED ORDER — RANITIDINE HCL 150 MG PO TABS
150.0000 mg | ORAL_TABLET | Freq: Once | ORAL | Status: AC
  Administered 2016-01-19: 150 mg via ORAL

## 2016-01-19 MED ORDER — METHYLPREDNISOLONE SODIUM SUCC 125 MG IJ SOLR
125.0000 mg | Freq: Once | INTRAMUSCULAR | Status: AC
  Administered 2016-01-19: 125 mg via INTRAMUSCULAR

## 2016-01-19 MED ORDER — EPINEPHRINE 0.3 MG/0.3ML IJ SOAJ: 0.3000 mg | Freq: Once | INTRAMUSCULAR | Status: DC

## 2016-01-19 NOTE — Progress Notes (Signed)
01/19/2016 2:13 PM   DOB: 06-Mar-1981 / MRN: [id number]  SUBJECTIVE:  [name] is a 35 y.o. female presenting for throat burning that started about 1 hour ago.  This started about 1 hour ago and occurred immediately after eating a banana.  She denies SOB, presyncope, rash, throat, lip and tongue swelling.  She does not have an epi pen.  She has taken some generic cetirizine and reports this helped her symptoms. She reports a history of allergies to mango.    She is allergic to dust mite extract and pollen extract.   She  has a past medical history of gallstones; Abnormal Pap smear; Hypertension; and Obese (11/06/2012).    She  reports that she has never smoked. She has never used smokeless tobacco. She reports that she does not drink alcohol or use illicit drugs. She  reports that she currently engages in sexual activity. She reports using the following method of birth control/protection: None. The patient  has past surgical history that includes Cholecystectomy, laparoscopic (2012).  Her family history includes Diabetes in her maternal grandfather and paternal grandmother.  Review of Systems  Constitutional: Negative for fever.  Gastrointestinal: Negative for nausea.  Skin: Negative for rash.  Neurological: Negative for dizziness.    Problem list and medications reviewed and updated by myself where necessary, and exist elsewhere in the encounter.   OBJECTIVE:  BP 118/76 mmHg  Pulse 76  Temp(Src) 98.1 F (36.7 C)  Resp 16  Ht 5\' 1"  (1.549 m)  Wt 189 lb (85.73 kg)  BMI 35.73 kg/m2  SpO2 99%  Physical Exam  Constitutional: She is oriented to person, place, and time. She appears well-nourished. No distress.  HENT:  Right Ear: Tympanic membrane normal.  Left Ear: Tympanic membrane normal.  Nose: Nose normal.  Mouth/Throat: Uvula is midline, oropharynx is clear and moist and mucous membranes are normal.    Eyes: EOM are normal. Pupils are equal, round, and reactive to  light.  Cardiovascular: Normal rate, regular rhythm, normal heart sounds and intact distal pulses.   Pulmonary/Chest: Effort normal and breath sounds normal. No respiratory distress. She has no wheezes. She has no rales. She exhibits no tenderness.  Abdominal: She exhibits no distension.  Neurological: She is alert and oriented to person, place, and time. No cranial nerve deficit. Gait normal.  Skin: Skin is dry. She is not diaphoretic.  Psychiatric: She has a normal mood and affect.  Vitals reviewed.   Results for orders placed or performed in visit on 01/19/16 (from the past 72 hour(s))  POCT glucose (manual entry)     Status: None   Collection Time: 01/19/16  2:09 PM  Result Value Ref Range   POC Glucose 85 70 - 99 mg/dl    No results found.  ASSESSMENT AND PLAN  [name] was seen today for allergic reaction and shortness of breath.  Diagnoses and all orders for this visit:  Burning sensation of throat: I see no evidence of anaphylaxis at this time, however I am concerned given her HPI.  Will treat.  RTC and ED precautions discussed with patient.   -     POCT glucose (manual entry) -     methylPREDNISolone sodium succinate (SOLU-MEDROL) 125 mg/2 mL injection 125 mg; Inject 2 mLs (125 mg total) into the muscle once. -     ranitidine (ZANTAC) tablet 150 mg; Take 1 tablet (150 mg total) by mouth once. -     ranitidine (ZANTAC) tablet 150 mg; Take  1 tablet (150 mg total) by mouth once.  Multiple food allergies -     Ambulatory referral to Allergy -     EPINEPHrine 0.3 mg/0.3 mL IJ SOAJ injection; Inject 0.3 mLs (0.3 mg total) into the muscle once.    The patient was advised to call or return to clinic if she does not see an improvement in symptoms, or to seek the care of the closest emergency department if she worsens with the above plan.   Deliah Boston, MHS, PA-C [facility] 01/19/2016 2:13 PM

## 2016-01-19 NOTE — Patient Instructions (Signed)
Take Zantac 1 tablet every morning and 1 tablet in the evening. Also take Zyrtec 1 tablet daily.    IF you received an x-ray today, you will receive an invoice from [facility]. Please contact [facility] at [phone] with questions or concerns regarding your invoice.   IF you received labwork today, you will receive an invoice from [facility]. Please contact [facility] at [phone] with questions or concerns regarding your invoice.   Our billing staff will not be able to assist you with questions regarding bills from these companies.  You will be contacted with the lab results as soon as they are available. The fastest way to get your results is to activate your My Chart account. Instructions are located on the last page of this paperwork. If you have not heard from us regarding the results in 2 weeks, please contact this office.

## 2016-02-03 ENCOUNTER — Ambulatory Visit (INDEPENDENT_AMBULATORY_CARE_PROVIDER_SITE_OTHER): Payer: 59

## 2016-02-03 ENCOUNTER — Ambulatory Visit (HOSPITAL_COMMUNITY)
Admission: EM | Admit: 2016-02-03 | Discharge: 2016-02-03 | Disposition: A | Discharge status: Home / Self Care | Payer: 59 | Attending: Family Medicine | Admitting: Family Medicine

## 2016-02-03 ENCOUNTER — Encounter (HOSPITAL_COMMUNITY): Payer: Self-pay | Admitting: Emergency Medicine

## 2016-02-03 DIAGNOSIS — S46912A Strain of unspecified muscle, fascia and tendon at shoulder and upper arm level, left arm, initial encounter: Secondary | ICD-10-CM
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
## 2016-03-17 ENCOUNTER — Emergency Department (HOSPITAL_COMMUNITY)
Admission: EM | Admit: 2016-03-17 | Discharge: 2016-03-17 | Disposition: A | Discharge status: Home / Self Care | Payer: 59 | Attending: Emergency Medicine | Admitting: Emergency Medicine

## 2016-03-17 ENCOUNTER — Encounter (HOSPITAL_COMMUNITY): Payer: Self-pay | Admitting: Emergency Medicine

## 2016-03-17 DIAGNOSIS — S61421A Laceration with foreign body of right hand, initial encounter: Secondary | ICD-10-CM | POA: Insufficient documentation

## 2016-03-17 DIAGNOSIS — W25XXXA Contact with sharp glass, initial encounter: Secondary | ICD-10-CM | POA: Insufficient documentation

## 2016-03-17 DIAGNOSIS — S61411A Laceration without foreign body of right hand, initial encounter: Secondary | ICD-10-CM

## 2016-03-17 DIAGNOSIS — Y929 Unspecified place or not applicable: Secondary | ICD-10-CM | POA: Insufficient documentation

## 2016-03-17 DIAGNOSIS — Y999 Unspecified external cause status: Secondary | ICD-10-CM | POA: Insufficient documentation

## 2016-03-17 DIAGNOSIS — Y939 Activity, unspecified: Secondary | ICD-10-CM | POA: Insufficient documentation

## 2016-03-17 DIAGNOSIS — I10 Essential (primary) hypertension: Secondary | ICD-10-CM | POA: Diagnosis not present

## 2016-03-17 MED ORDER — BACITRACIN ZINC 500 UNIT/GM EX OINT: TOPICAL_OINTMENT | Freq: Two times a day (BID) | CUTANEOUS | Status: DC

## 2016-03-17 MED ORDER — LIDOCAINE HCL (PF) 1 % IJ SOLN
5.0000 mL | Freq: Once | INTRAMUSCULAR | Status: AC
  Administered 2016-03-17: 5 mL
  Filled 2016-03-17: qty 5

## 2016-03-17 NOTE — ED Provider Notes (Signed)
[facility]-EMERGENCY DEPT Provider Note   CSN: [id number] Arrival date & time: 03/17/16  1703  By signing my name below, I, [name], attest that this documentation has been prepared under the direction and in the presence of [name], NP. Electronically Signed: Angelene GiovanniEmmanuella Snyder, ED Scribe. 03/17/16. 8:38 PM  History   Chief Complaint Chief Complaint  Patient presents with  . Laceration   HPI Comments: [name] is a 35 y.o. female who presents to the Emergency Department complaining of a laceration to the palmar aspect of her right hand s/p injury that occurred PTA. Pt explains that she went to catch a glass when it broke and lacerated her hand. No alleviating factors noted. Pt has not tried any medications PTA. She reports that her tetanus vaccine is UTD. No fever, chills, or generalized rash.   The history is provided by the patient. No language interpreter was used.    Past Medical History:  Diagnosis Date  . Abnormal Pap smear   . Hx of gallstones   . Hypertension    pre eclampsia first pregnancy  . Obese 11/06/2012    Patient Active Problem List   Diagnosis Date Noted  . Vaginal delivery 11/07/2012  . Obese 11/06/2012  . Hx of preeclampsia, prior pregnancy, currently pregnant   . Abnormal Pap smear     Past Surgical History:  Procedure Laterality Date  . CHOLECYSTECTOMY, LAPAROSCOPIC  2012    OB History    Gravida Para Term Preterm AB Living   4 4 4     4    SAB TAB Ectopic Multiple Live Births           4       Home Medications    Prior to Admission medications   Medication Sig Start Date End Date Taking? Authorizing Provider  EPINEPHrine 0.3 mg/0.3 mL IJ SOAJ injection Inject 0.3 mLs (0.3 mg total) into the muscle once. 01/19/16   Ofilia NeasMichael L Clark, PA-C    Family History Family History  Problem Relation Age of Onset  . Diabetes Maternal Grandfather   . Diabetes Paternal Grandmother     Social History Social History  Substance Use  Topics  . Smoking status: Never Smoker  . Smokeless tobacco: Never Used  . Alcohol use No     Allergies   Dust mite extract and Pollen extract   Review of Systems Review of Systems  Constitutional: Negative for chills and fever.  Skin: Positive for wound. Negative for rash.  all other systems negative   Physical Exam Updated Vital Signs BP 125/85 (BP Location: Right Arm)   Pulse 77   Temp 98.2 F (36.8 C) (Oral)   Resp 18   LMP 03/11/2016   SpO2 99%   Physical Exam  Constitutional: She is oriented to person, place, and time. She appears well-developed and well-nourished.  HENT:  Head: Normocephalic and atraumatic.  Cardiovascular: Normal rate.   Pulmonary/Chest: Effort normal.  Neurological: She is alert and oriented to person, place, and time.  Skin: Skin is warm and dry. Laceration noted.  2 cm laceration to the palmar aspect of her right hand on the ulna side.   Psychiatric: She has a normal mood and affect.  Nursing note and vitals reviewed.    ED Treatments / Results  DIAGNOSTIC STUDIES: Oxygen Saturation is 99% on RA, normal by my interpretation.    COORDINATION OF CARE: 7:45 PM- Pt advised of plan for treatment and pt agrees. Pt will receive laceration repair.  She will also receive bacitracin.    Procedures .Marland KitchenLaceration Repair Date/Time: 03/17/2016 7:48 PM Performed by: Janne Napoleon Authorized by: Janne Napoleon   Consent:    Consent obtained:  Verbal   Consent given by:  Patient   Risks discussed:  Pain Anesthesia (see MAR for exact dosages):    Anesthesia method:  Local infiltration   Local anesthetic:  Lidocaine 1% w/o epi Laceration details:    Location:  Hand   Hand location:  R palm   Length (cm):  2 Repair type:    Repair type:  Simple Treatment:    Area cleansed with:  Betadine   Amount of cleaning:  Standard   Irrigation method:  Syringe   Visualized foreign bodies/material removed: no   Skin repair:    Repair method:  Sutures    Suture size:  5-0   Suture material:  Prolene   Suture technique:  Simple interrupted   Number of sutures:  2 Post-procedure details:    Dressing:  Sterile dressing and antibiotic ointment   Patient tolerance of procedure:  Tolerated well, no immediate complications     (including critical care time)  Medications Ordered in ED Medications  lidocaine (PF) (XYLOCAINE) 1 % injection 5 mL (5 mLs Infiltration Given by Other 03/17/16 2023)     Initial Impression / Assessment and Plan / ED Course  Kerrie Buffalo, NP has reviewed the triage vital signs and the nursing notes.   Clinical Course   Tetanus vaccine UTD. Pressure irrigation performed. Laceration occurred < 8 hours prior to repair which was well tolerated. Pt has no co morbidities to effect normal wound healing. Discussed suture home care w pt and answered questions. Pt to f-u for wound check and suture removal in 7 days. Pt is hemodynamically stable w no complaints prior to dc.    Final Clinical Impressions(s) / ED Diagnoses   Final diagnoses:  Hand laceration, right, initial encounter    New Prescriptions Discharge Medication List as of 03/17/2016  8:37 PM     I personally performed the services described in this documentation, which was scribed in my presence. The recorded information has been reviewed and is accurate.    [address] [place], [place] 03/18/16 1610    Shaune Pollack, MD 03/18/16 1344

## 2016-03-17 NOTE — ED Triage Notes (Signed)
Pt reports she went to catch glass and it cut her. Cut to right lateral hand with bleeding controlled. No glass noted. Tetanus was two years ago.

## 2016-03-17 NOTE — Discharge Instructions (Signed)
Follow up in 5 days for suture removal or sooner for any problems.

## 2016-04-22 ENCOUNTER — Ambulatory Visit (HOSPITAL_COMMUNITY)
Admission: EM | Admit: 2016-04-22 | Discharge: 2016-04-22 | Disposition: A | Discharge status: Home / Self Care | Payer: 59 | Attending: Emergency Medicine | Admitting: Emergency Medicine

## 2016-04-22 ENCOUNTER — Encounter (HOSPITAL_COMMUNITY): Payer: Self-pay | Admitting: Emergency Medicine

## 2016-04-22 DIAGNOSIS — H01002 Unspecified blepharitis right lower eyelid: Secondary | ICD-10-CM

## 2016-04-22 DIAGNOSIS — H02843 Edema of right eye, unspecified eyelid: Secondary | ICD-10-CM

## 2016-04-22 NOTE — ED Triage Notes (Signed)
Here for stye to right lower eye lid onset yest associated w/swelling and itching  A&O x4... NAD

## 2016-04-22 NOTE — ED Provider Notes (Signed)
CSN: [id number]     Arrival date & time 04/22/16  1015 History   None    Chief Complaint  Patient presents with  . Stye   (Consider location/radiation/quality/duration/timing/severity/associated sxs/prior Treatment) 35 yr old AA female presents to UC for right eye lower lid irritation/itching x 1 day, denies trauma or injury to eyes, wears glasses. No known insect bite. Pt took zyrtec today for symptoms without relief.    The history is provided by the patient. No language interpreter was used.    Past Medical History:  Diagnosis Date  . Abnormal Pap smear   . Hx of gallstones   . Hypertension    pre eclampsia first pregnancy  . Obese 11/06/2012   Past Surgical History:  Procedure Laterality Date  . CHOLECYSTECTOMY, LAPAROSCOPIC  2012   Family History  Problem Relation Age of Onset  . Diabetes Maternal Grandfather   . Diabetes Paternal Grandmother    Social History  Substance Use Topics  . Smoking status: Never Smoker  . Smokeless tobacco: Never Used  . Alcohol use No   OB History    Gravida Para Term Preterm AB Living   4 4 4     4    SAB TAB Ectopic Multiple Live Births           4     Review of Systems  Constitutional: Negative for fever.  HENT: Negative.   Eyes: Positive for itching. Negative for photophobia, pain, discharge, redness and visual disturbance.       Reports right lower eyelid soreness  Respiratory: Negative.   Cardiovascular: Negative.   Gastrointestinal: Negative.   Endocrine: Negative.   Genitourinary: Negative.   Musculoskeletal: Negative.   Skin: Negative.   Allergic/Immunologic: Positive for environmental allergies.  Neurological: Negative.   Hematological: Negative.   Psychiatric/Behavioral: Negative.   All other systems reviewed and are negative.   Allergies  Dust mite extract and Pollen extract  Home Medications   Prior to Admission medications   Medication Sig Start Date End Date Taking? Authorizing Provider  EPINEPHrine  0.3 mg/0.3 mL IJ SOAJ injection Inject 0.3 mLs (0.3 mg total) into the muscle once. 01/19/16   Ofilia NeasMichael L Clark, PA-C   Meds Ordered and Administered this Visit  Medications - No data to display  BP 111/63 (BP Location: Left Arm)   Pulse 77   Temp 98 F (36.7 C) (Oral)   Resp 12   SpO2 100%  No data found.   Physical Exam  Constitutional: She is oriented to person, place, and time. Vital signs are normal. She appears well-developed and well-nourished. She is active and cooperative.  Non-toxic appearance. She does not have a sickly appearance. She does not appear ill. No distress.  HENT:  Head: Normocephalic.  Right Ear: Tympanic membrane normal.  Left Ear: Tympanic membrane normal.  Nose: Nose normal.  Mouth/Throat: Oropharynx is clear and moist and mucous membranes are normal.  Airway patent, no stridor  Eyes: Conjunctivae and EOM are normal. Pupils are equal, round, and reactive to light. Lids are everted and swept, no foreign bodies found. Right eye exhibits hordeolum. Right eye exhibits no chemosis, no discharge and no exudate. No foreign body present in the right eye. Left eye exhibits no discharge. No scleral icterus.    Neck: Trachea normal and normal range of motion.  Cardiovascular: Normal rate, regular rhythm and normal heart sounds.   Pulmonary/Chest: Effort normal.  Abdominal: Normal appearance.  Musculoskeletal: Normal range of motion.  Neurological: She is  alert and oriented to person, place, and time. She has normal strength. No cranial nerve deficit or sensory deficit. Gait normal. GCS eye subscore is 4. GCS verbal subscore is 5. GCS motor subscore is 6.  Skin: Skin is warm and dry. No rash noted.  Psychiatric: She has a normal mood and affect. Her speech is normal and behavior is normal. Judgment and thought content normal. Cognition and memory are normal.  Nursing note and vitals reviewed.   Urgent Care Course   Clinical Course    Procedures (including  critical care time)  Labs Review Labs Reviewed - No data to display  Imaging Review No results found.        MDM   1. Swelling of right eyelid   2. Blepharitis of right lower eyelid, unspecified type    Discussed plan of care with pt. Pt verbalized understanding to this provider. Wash hands before touching eyes, avoid rubbing eyes. May take OTC Benadryl for allergy symptom management. Follow up with PCP of your choice or [facility]. Return to UC as needed.  DDX: insect bite, stye, periorbital cellulitis.    Clancy Gourd, NP 04/22/16 1301

## 2016-04-22 NOTE — Discharge Instructions (Signed)
Wash hands prior to touching eyes. May take OTC benadryl for allergy symptoms. Follow up with PCP of your choice or [facility]. Return to UC as needed.

## 2017-03-19 ENCOUNTER — Encounter (HOSPITAL_COMMUNITY): Payer: Self-pay | Admitting: Emergency Medicine

## 2017-03-19 ENCOUNTER — Ambulatory Visit (HOSPITAL_COMMUNITY)
Admission: EM | Admit: 2017-03-19 | Discharge: 2017-03-19 | Disposition: A | Discharge status: Home / Self Care | Payer: PRIVATE HEALTH INSURANCE | Attending: Emergency Medicine | Admitting: Emergency Medicine

## 2017-03-19 DIAGNOSIS — S29019A Strain of muscle and tendon of unspecified wall of thorax, initial encounter: Secondary | ICD-10-CM | POA: Diagnosis not present

## 2017-03-19 DIAGNOSIS — S161XXA Strain of muscle, fascia and tendon at neck level, initial encounter: Secondary | ICD-10-CM

## 2017-03-19 DIAGNOSIS — S39012A Strain of muscle, fascia and tendon of lower back, initial encounter: Secondary | ICD-10-CM

## 2017-03-19 DIAGNOSIS — S93401A Sprain of unspecified ligament of right ankle, initial encounter: Secondary | ICD-10-CM

## 2017-03-19 MED ORDER — CYCLOBENZAPRINE HCL 5 MG PO TABS: 5.0000 mg | ORAL_TABLET | Freq: Three times a day (TID) | ORAL | 0 refills | Status: DC | PRN

## 2017-03-19 MED ORDER — NAPROXEN 375 MG PO TABS: 375.0000 mg | ORAL_TABLET | Freq: Two times a day (BID) | ORAL | 0 refills | Status: DC
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
## 2017-03-19 NOTE — Discharge Instructions (Signed)
Heat and stretches to the involved muscles as discussed and demonstrated. Take the medicine as directed. The muscle relaxant can cause drowsiness. Note for work. Limit weightbearing and standing on the right foot/ankle for the next few days.

## 2017-05-22 ENCOUNTER — Ambulatory Visit (HOSPITAL_COMMUNITY)
Admission: EM | Admit: 2017-05-22 | Discharge: 2017-05-22 | Disposition: A | Discharge status: Home / Self Care | Payer: PRIVATE HEALTH INSURANCE | Attending: Emergency Medicine | Admitting: Emergency Medicine

## 2017-05-22 ENCOUNTER — Encounter (HOSPITAL_COMMUNITY): Payer: Self-pay | Admitting: Emergency Medicine

## 2017-05-22 DIAGNOSIS — M25571 Pain in right ankle and joints of right foot: Secondary | ICD-10-CM

## 2017-05-22 DIAGNOSIS — M25512 Pain in left shoulder: Secondary | ICD-10-CM | POA: Diagnosis not present

## 2017-05-22 DIAGNOSIS — M25572 Pain in left ankle and joints of left foot: Secondary | ICD-10-CM | POA: Diagnosis not present

## 2017-05-22 DIAGNOSIS — M545 Low back pain, unspecified: Secondary | ICD-10-CM

## 2017-05-22 MED ORDER — PREDNISONE 20 MG PO TABS: 40.0000 mg | ORAL_TABLET | Freq: Every day | ORAL | 0 refills | Status: AC

## 2017-05-22 MED ORDER — MELOXICAM 7.5 MG PO TABS: 7.5000 mg | ORAL_TABLET | Freq: Every day | ORAL | 0 refills | Status: DC

## 2017-05-22 MED ORDER — CYCLOBENZAPRINE HCL 5 MG PO TABS: 5.0000 mg | ORAL_TABLET | Freq: Every evening | ORAL | 0 refills | Status: DC | PRN

## 2017-05-22 MED ORDER — KETOROLAC TROMETHAMINE 30 MG/ML IJ SOLN
INTRAMUSCULAR | Status: AC
  Filled 2017-05-22: qty 1

## 2017-05-22 MED ORDER — KETOROLAC TROMETHAMINE 30 MG/ML IJ SOLN
30.0000 mg | Freq: Once | INTRAMUSCULAR | Status: AC
  Administered 2017-05-22: 30 mg via INTRAMUSCULAR
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
## 2017-05-22 NOTE — Discharge Instructions (Signed)
Your symptoms could be due to muscle strain/inflammation. Start Mobic as directed. Do not take ibuprofen/naproxen while you are on mobic. Flexeril as needed at night. Flexeril can make you drowsy, so do not take if you are going to drive, operate heavy machinery, or make important decisions. Ice/heat compresses as needed. This can take up to 3-4 weeks to completely resolve, but you should be feeling better each week. Follow up with PCP/orthopedics if symptoms worsen, changes for reevaluation. If experience numbness/tingling of the inner thighs, loss of bladder or bowel control, go to the emergency department for evaluation.
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
## 2017-12-03 ENCOUNTER — Other Ambulatory Visit: Payer: Self-pay

## 2017-12-03 ENCOUNTER — Emergency Department (HOSPITAL_COMMUNITY)
Admission: EM | Admit: 2017-12-03 | Discharge: 2017-12-03 | Disposition: A | Discharge status: Home / Self Care | Payer: 59 | Attending: Emergency Medicine | Admitting: Emergency Medicine

## 2017-12-03 ENCOUNTER — Encounter (HOSPITAL_COMMUNITY): Payer: Self-pay | Admitting: Emergency Medicine

## 2017-12-03 DIAGNOSIS — R0981 Nasal congestion: Secondary | ICD-10-CM | POA: Diagnosis present

## 2017-12-03 DIAGNOSIS — Z79899 Other long term (current) drug therapy: Secondary | ICD-10-CM | POA: Insufficient documentation

## 2017-12-03 DIAGNOSIS — K0889 Other specified disorders of teeth and supporting structures: Secondary | ICD-10-CM | POA: Diagnosis not present

## 2017-12-03 DIAGNOSIS — G43009 Migraine without aura, not intractable, without status migrainosus: Secondary | ICD-10-CM | POA: Insufficient documentation

## 2017-12-03 DIAGNOSIS — I10 Essential (primary) hypertension: Secondary | ICD-10-CM | POA: Diagnosis not present

## 2017-12-03 MED ORDER — LIDOCAINE VISCOUS HCL 2 % MT SOLN: 15.0000 mL | OROMUCOSAL | 0 refills | Status: DC | PRN

## 2017-12-03 MED ORDER — KETOROLAC TROMETHAMINE 30 MG/ML IJ SOLN
30.0000 mg | Freq: Once | INTRAMUSCULAR | Status: AC
  Administered 2017-12-03: 30 mg via INTRAVENOUS
  Filled 2017-12-03: qty 1

## 2017-12-03 MED ORDER — SODIUM CHLORIDE 0.9 % IV BOLUS
500.0000 mL | Freq: Once | INTRAVENOUS | Status: AC
  Administered 2017-12-03: 500 mL via INTRAVENOUS

## 2017-12-03 MED ORDER — PROCHLORPERAZINE EDISYLATE 10 MG/2ML IJ SOLN
10.0000 mg | Freq: Once | INTRAMUSCULAR | Status: AC
  Administered 2017-12-03: 10 mg via INTRAVENOUS
  Filled 2017-12-03: qty 2

## 2017-12-03 MED ORDER — IBUPROFEN 800 MG PO TABS: 800.0000 mg | ORAL_TABLET | Freq: Three times a day (TID) | ORAL | 0 refills | Status: DC

## 2017-12-03 MED ORDER — PENICILLIN V POTASSIUM 500 MG PO TABS: 500.0000 mg | ORAL_TABLET | Freq: Four times a day (QID) | ORAL | 0 refills | Status: AC

## 2017-12-03 MED ORDER — DIPHENHYDRAMINE HCL 50 MG/ML IJ SOLN
25.0000 mg | Freq: Once | INTRAMUSCULAR | Status: AC
  Administered 2017-12-03: 25 mg via INTRAVENOUS
  Filled 2017-12-03: qty 1

## 2017-12-03 NOTE — ED Provider Notes (Signed)
[facility] EMERGENCY DEPARTMENT Provider Note   CSN: [id number] Arrival date & time: 12/03/17  0848     History   Chief Complaint Chief Complaint  Patient presents with  . Dental Pain  . Nasal Congestion  . Cough    HPI [name] is a 37 y.o. female with a history of hypertension and seasonal allergies who presents to the emergency department with multiple complaints.  The patient endorses a bilateral, posterior upper tooth pain that began 1 week ago.  Left greater than right.  She states the pain from her left upper tooth radiates to her left ear.  The pain is severe, worsening, constant, and characterized as throbbing.  She denies drooling, trismus, muffled voice, or dysphagia.  She states that the left side of her face might be somewhat swollen.  No left-sided neck swelling.  She also endorses a left-sided headache that began 1 week ago that has been progressively worsening.  She reports a remote history of headaches, but has not had one in some time.  She states that she came to the emergency department for evaluation of her symptoms this morning after she developed phonophobia, photophobia, and intermittent dizziness last night.  She denies fever, chills, neck pain or stiffness, weakness, numbness, speech changes, nausea, vomiting, diplopia, visual changes, dyspnea, lightheadedness, otalgia, or otorrhea.  She also reports worsening nasal congestion over the last week.  She states that she has not been taking her home allergy medication.  She also reports intermittent left-sided chest pain that began over the last few days.  She denies cough, dyspnea, palpitations, abdominal pain, wheezing, or leg swelling.  No known aggravating or alleviating factors.  No known trauma, injury, or new activities or exercises.  No history of similar.  Chest pain will come and go and does not seem to be associated with her other symptoms.  She has been treating her symptoms  with Advil and Tylenol with some relief.  No known sick contacts.  She works in a [employer].  The history is provided by the patient. No language interpreter was used.    Past Medical History:  Diagnosis Date  . Abnormal Pap smear   . Hx of gallstones   . Hypertension    pre eclampsia first pregnancy  . Obese 11/06/2012    Patient Active Problem List   Diagnosis Date Noted  . Vaginal delivery 11/07/2012  . Obese 11/06/2012  . Hx of preeclampsia, prior pregnancy, currently pregnant   . Abnormal Pap smear     Past Surgical History:  Procedure Laterality Date  . CHOLECYSTECTOMY, LAPAROSCOPIC  2012     OB History    Gravida  4   Para  4   Term  4   Preterm      AB      Living  4     SAB      TAB      Ectopic      Multiple      Live Births  4            Home Medications    Prior to Admission medications   Medication Sig Start Date End Date Taking? Authorizing Provider  cyclobenzaprine (FLEXERIL) 5 MG tablet Take 1 tablet (5 mg total) at bedtime as needed by mouth for muscle spasms. 05/22/17   Cathie Hoops, Amy V, PA-C  EPINEPHrine 0.3 mg/0.3 mL IJ SOAJ injection Inject 0.3 mLs (0.3 mg total) into the muscle once. 01/19/16  Ofilia Neas, PA-C  ibuprofen (ADVIL,MOTRIN) 800 MG tablet Take 1 tablet (800 mg total) by mouth 3 (three) times daily. 12/03/17   Jaquavious Mercer A, PA-C  lidocaine (XYLOCAINE) 2 % solution Use as directed 15 mLs in the mouth or throat every 3 (three) hours as needed for mouth pain. 12/03/17   Jaxtin Raimondo A, PA-C  meloxicam (MOBIC) 7.5 MG tablet Take 1 tablet (7.5 mg total) daily by mouth. 05/22/17   Yu, Amy V, PA-C  penicillin v potassium (VEETID) 500 MG tablet Take 1 tablet (500 mg total) by mouth 4 (four) times daily for 7 days. 12/03/17 12/10/17  Shaka Cardin, Coral Else, PA-C    Family History Family History  Problem Relation Age of Onset  . Diabetes Maternal Grandfather   . Diabetes Paternal Grandmother     Social History Social History    Tobacco Use  . Smoking status: Never Smoker  . Smokeless tobacco: Never Used  Substance Use Topics  . Alcohol use: No  . Drug use: No     Allergies   Dust mite extract and Pollen extract   Review of Systems Review of Systems  Constitutional: Negative for activity change, chills and fever.  HENT: Positive for congestion, dental problem and ear pain. Negative for drooling, facial swelling, rhinorrhea, sinus pressure, sneezing, sore throat, trouble swallowing and voice change.        Phonophobia  Eyes: Positive for photophobia. Negative for visual disturbance.  Respiratory: Negative for cough, shortness of breath and wheezing.   Cardiovascular: Positive for chest pain. Negative for palpitations and leg swelling.  Gastrointestinal: Negative for abdominal pain and nausea.  Genitourinary: Negative for dysuria.  Musculoskeletal: Negative for back pain.  Skin: Negative for rash.  Allergic/Immunologic: Negative for immunocompromised state.  Neurological: Positive for dizziness and headaches. Negative for tremors, syncope, facial asymmetry, speech difficulty, weakness, light-headedness and numbness.  Psychiatric/Behavioral: Negative for confusion.   Physical Exam Updated Vital Signs BP (!) 127/96 (BP Location: Right Arm)   Pulse 71   Temp 98.2 F (36.8 C) (Oral)   Resp 16   LMP 11/26/2017   SpO2 97%   Physical Exam  Constitutional: She is oriented to person, place, and time. No distress.  Obese female.  She is nontoxic-appearing.  She is not ill-appearing.  HENT:  Head: Normocephalic and atraumatic.  Right Ear: Hearing, tympanic membrane, external ear and ear canal normal.  Left Ear: Hearing, tympanic membrane, external ear and ear canal normal.  Nose: Rhinorrhea present. Right sinus exhibits no maxillary sinus tenderness and no frontal sinus tenderness. Left sinus exhibits no maxillary sinus tenderness and no frontal sinus tenderness.  Mouth/Throat: Uvula is midline,  oropharynx is clear and moist and mucous membranes are normal. No trismus in the jaw. Abnormal dentition. No dental abscesses or dental caries. No tonsillar exudate.  Tooth 1 and 16 are fractured.  No loose or missing teeth.  No dental caries.  No surrounding erythema or edema to the gingiva.  No gross dental abscess.  No sublingual induration or edema.  Eyes: Pupils are equal, round, and reactive to light. Conjunctivae and EOM are normal. Right eye exhibits no discharge. Left eye exhibits no discharge. No scleral icterus.  Neck: Normal range of motion. Neck supple. No JVD present. No tracheal deviation present. No thyromegaly present.  Cardiovascular: Normal rate, regular rhythm, normal heart sounds and intact distal pulses. Exam reveals no gallop and no friction rub.  No murmur heard. Pulmonary/Chest: Effort normal. No stridor. No respiratory distress. She  has no wheezes. She has no rales. She exhibits no tenderness.  Reproducible tenderness to palpation to the musculature of the left anterior chest wall. No crepitus, deformities, or step-offs to the ribs.  No overlying erythema, edema or warmth.  No rash.  Abdominal: Soft. She exhibits no distension and no mass. There is no tenderness. There is no rebound and no guarding. No hernia.  Musculoskeletal: She exhibits no tenderness.  Lymphadenopathy:    She has no cervical adenopathy.  Neurological: She is alert and oriented to person, place, and time. No cranial nerve deficit or sensory deficit. She exhibits normal muscle tone. Coordination normal.  Skin: Skin is warm. Capillary refill takes less than 2 seconds. No rash noted. She is not diaphoretic.  Psychiatric: Her behavior is normal.  Nursing note and vitals reviewed.    ED Treatments / Results  Labs (all labs ordered are listed, but only abnormal results are displayed) Labs Reviewed - No data to display  EKG None  Radiology No results found.  Procedures Procedures (including  critical care time)  Medications Ordered in ED Medications  sodium chloride 0.9 % bolus 500 mL (500 mLs Intravenous New Bag/Given 12/03/17 1338)  ketorolac (TORADOL) 30 MG/ML injection 30 mg (30 mg Intravenous Given 12/03/17 1340)  diphenhydrAMINE (BENADRYL) injection 25 mg (25 mg Intravenous Given 12/03/17 1339)  prochlorperazine (COMPAZINE) injection 10 mg (10 mg Intravenous Given 12/03/17 1339)     Initial Impression / Assessment and Plan / ED Course  I have reviewed the triage vital signs and the nursing notes.  Pertinent labs & imaging results that were available during my care of the patient were reviewed by me and considered in my medical decision making (see chart for details).  Clinical Course as of Dec 04 1426  Sun Dec 03, 2017  1416 Patient rechecked.  Headache has improved from a 10 to a 5.  IV fluid bolus is continuing to run.  We will plan for discharge when fluids have finished.   [MM]    Clinical Course User Index [MM] Jemimah Cressy A, PA-C    37 year old female with a history of hypertension and seasonal allergies presenting with dental pain, left otalgia, nasal congestion, left-sided headache, left anterior chest wall pain for 1 week.  On exam, tooth 1 and 16 are fractured.  No gross dental abscess, trismus, muffled voice, drooling, or sublingual edema or induration.  Discharge the patient with symptomatic treatment, viscous lidocaine, and penicillin.  She has been given a Writer guide to get established with a dentist.  She has reproducible tenderness to palpation to the musculature of the left anterior chest wall on exam.  Lungs are clear to auscultation bilaterally.  Heart is regular rate and rhythm.  Doubt ACS, PE, pneumonia, influenza, cardiac tamponade, or pericarditis at this time.  Suspect musculoskeletal injury given that the patient works in a physically demanding job.  No focal neurologic deficits.  She is nontoxic and well-appearing. Migraine cocktail  of Toradol, Compazine, Benadryl, and IV fluid bolus given.  The patient reports that her headache has resolved.  Will provide the patient with resources to get established with a primary care provider for recheck if her headache returns.  Doubt ICH, SAH, meningitis, antral vertigo, or CVA at this time.  At this time, I feel that no further emergent work-up is indicated.  The patient is much improved, hemodynamically stable, and in no acute distress.  She is safe for discharge to home with outpatient follow-up at this time.  Final Clinical Impressions(s) / ED Diagnoses   Final diagnoses:  Migraine without aura and without status migrainosus, not intractable  Dentalgia    ED Discharge Orders        Ordered    ibuprofen (ADVIL,MOTRIN) 800 MG tablet  3 times daily     12/03/17 1328    lidocaine (XYLOCAINE) 2 % solution  Every  3 hours PRN     12/03/17 1328    penicillin v potassium (VEETID) 500 MG tablet  4 times daily     12/03/17 1328       Yardley Beltran A, PA-C 12/03/17 1428    Nira Conn, MD 12/03/17 1650

## 2017-12-03 NOTE — ED Triage Notes (Signed)
Friend stated, it hurts her to talk because of tooth problem, cough and congestion for over a week.

## 2017-12-03 NOTE — Discharge Instructions (Signed)
Thank you for allowing me to provide your care today in the emergency department.  Please use the attached dental resource guide to call and get established with a dentist to follow-up regarding her symptoms today.  If you do not have a primary care provider, you can call the number on your discharge paperwork to get established with someone if your headache returns.  Take 1 tablet of penicillin every 6 hours for the next 7 days.  This is an antibiotic.  You can also swallow or swish and spit 15 mL of viscous lidocaine every 3 hours as needed for dental pain.  Take 1 tablet of 800 mg of ibuprofen with food every 8 hours as needed for pain.  You can also take at thousand mill grams of Tylenol once every 8 hours or alternate between these 2 medications and take 1 dose of each every 4 hours.  Return to the emergency department if you develop the inability to open your mouth, drooling, feeling as if your throat is closing, high fever despite taking Tylenol and ibuprofen, significant swelling to one side of your face or neck, muffled voice, or other new concerning symptoms.

## 2018-04-05 ENCOUNTER — Emergency Department (HOSPITAL_COMMUNITY): Payer: 59

## 2018-04-05 ENCOUNTER — Emergency Department (HOSPITAL_COMMUNITY)
Admission: EM | Admit: 2018-04-05 | Discharge: 2018-04-05 | Disposition: A | Discharge status: Home / Self Care | Payer: 59 | Attending: Emergency Medicine | Admitting: Emergency Medicine

## 2018-04-05 ENCOUNTER — Encounter (HOSPITAL_COMMUNITY): Payer: Self-pay

## 2018-04-05 ENCOUNTER — Other Ambulatory Visit: Payer: Self-pay

## 2018-04-05 DIAGNOSIS — Z91010 Allergy to peanuts: Secondary | ICD-10-CM | POA: Insufficient documentation

## 2018-04-05 DIAGNOSIS — Z79899 Other long term (current) drug therapy: Secondary | ICD-10-CM | POA: Diagnosis not present

## 2018-04-05 DIAGNOSIS — J45901 Unspecified asthma with (acute) exacerbation: Secondary | ICD-10-CM

## 2018-04-05 DIAGNOSIS — R0602 Shortness of breath: Secondary | ICD-10-CM | POA: Diagnosis present

## 2018-04-05 DIAGNOSIS — Z9049 Acquired absence of other specified parts of digestive tract: Secondary | ICD-10-CM | POA: Diagnosis not present

## 2018-04-05 DIAGNOSIS — F17210 Nicotine dependence, cigarettes, uncomplicated: Secondary | ICD-10-CM | POA: Diagnosis not present

## 2018-04-05 DIAGNOSIS — I10 Essential (primary) hypertension: Secondary | ICD-10-CM | POA: Insufficient documentation

## 2018-04-05 MED ORDER — IPRATROPIUM-ALBUTEROL 0.5-2.5 (3) MG/3ML IN SOLN
3.0000 mL | Freq: Once | RESPIRATORY_TRACT | Status: AC
  Administered 2018-04-05: 3 mL via RESPIRATORY_TRACT
  Filled 2018-04-05: qty 3

## 2018-04-05 MED ORDER — PREDNISONE 20 MG PO TABS
60.0000 mg | ORAL_TABLET | Freq: Once | ORAL | Status: AC
  Administered 2018-04-05: 60 mg via ORAL
  Filled 2018-04-05: qty 3

## 2018-04-05 MED ORDER — PREDNISONE 20 MG PO TABS: 40.0000 mg | ORAL_TABLET | Freq: Every day | ORAL | 0 refills | Status: DC

## 2018-04-05 MED ORDER — ALBUTEROL SULFATE (2.5 MG/3ML) 0.083% IN NEBU
2.5000 mg | INHALATION_SOLUTION | Freq: Once | RESPIRATORY_TRACT | Status: AC
  Administered 2018-04-05: 2.5 mg via RESPIRATORY_TRACT
  Filled 2018-04-05: qty 3

## 2018-04-05 MED ORDER — ALBUTEROL SULFATE HFA 108 (90 BASE) MCG/ACT IN AERS: 1.0000 | INHALATION_SPRAY | Freq: Four times a day (QID) | RESPIRATORY_TRACT | 2 refills | Status: AC | PRN

## 2018-04-05 MED ORDER — ALBUTEROL SULFATE HFA 108 (90 BASE) MCG/ACT IN AERS
1.0000 | INHALATION_SPRAY | Freq: Once | RESPIRATORY_TRACT | Status: DC
  Filled 2018-04-05: qty 6.7

## 2018-04-05 NOTE — ED Provider Notes (Addendum)
[facility] EMERGENCY DEPARTMENT Provider Note   CSN: [id number] Arrival date & time: 04/05/18  1309     History   Chief Complaint Chief Complaint  Patient presents with  . Shortness of Breath    HPI [name] is a 37 y.o. female.  HPI   37 year old female percents today with complaints of shortness of breath.  Patient notes approximately 2 weeks ago she was exposed to chemicals at work.  She notes this set off wheezing.  She notes a week ago she developed rhinorrhea, itchy watery eyes and nonproductive cough.  Patient notes that she has no diagnosed history of asthma but frequently use her friend's inhaler.  She notes that when she gets sick she has exacerbations of her wheezing.  She does note that she is a smoker.  She denies any fever or significant shortness of breath presently.  She received a breathing treatment prior to my evaluation is significant improved her symptoms.  She denies any chest pain.  Past Medical History:  Diagnosis Date  . Abnormal Pap smear   . Hx of gallstones   . Hypertension    pre eclampsia first pregnancy  . Obese 11/06/2012    Patient Active Problem List   Diagnosis Date Noted  . Vaginal delivery 11/07/2012  . Obese 11/06/2012  . Hx of preeclampsia, prior pregnancy, currently pregnant   . Abnormal Pap smear     Past Surgical History:  Procedure Laterality Date  . CHOLECYSTECTOMY, LAPAROSCOPIC  2012     OB History    Gravida  4   Para  4   Term  4   Preterm      AB      Living  4     SAB      TAB      Ectopic      Multiple      Live Births  4            Home Medications    Prior to Admission medications   Medication Sig Start Date End Date Taking? Authorizing Provider  Ascorbic Acid (VITAMIN C PO) Take 1 tablet by mouth daily.    Yes [provider]  clonazePAM (KLONOPIN) 1 MG tablet Take 1 mg by mouth 2 (two) times daily as needed for anxiety.  03/26/18  Yes [provider]  Cyanocobalamin (VITAMIN B-12 PO) Take 1 tablet by mouth daily.    Yes [provider]  cyclobenzaprine (FLEXERIL) 5 MG tablet Take 1 tablet (5 mg total) at bedtime as needed by mouth for muscle spasms. 05/22/17  Yes Yu, Amy V, PA-C  FLUoxetine (PROZAC) 20 MG capsule Take 20 mg by mouth daily. 03/26/18  Yes [provider]  gabapentin (NEURONTIN) 300 MG capsule Take 300 mg by mouth 3 (three) times daily. 03/26/18  Yes [provider]  Multiple Vitamins-Calcium (ONE-A-DAY WOMENS PO) Take 1 tablet by mouth daily.    Yes [provider]  PE-diphenhydrAMINE-DM-GG-APAP (MUCINEX FAST-MAX DAY/NIGHT TAB PO) Take 1 tablet by mouth at bedtime.   Yes [provider]  prazosin (MINIPRESS) 2 MG capsule Take 2 mg by mouth at bedtime. 03/26/18  Yes [provider]  topiramate (TOPAMAX) 50 MG tablet Take 50 mg by mouth 2 (two) times daily. 03/26/18  Yes [provider]  albuterol (PROVENTIL HFA;VENTOLIN HFA) 108 (90 Base) MCG/ACT inhaler Inhale 1-2 puffs into the lungs every 6 (six) hours as needed for wheezing or shortness of breath. 04/05/18  Miesha Bachmann, Tinnie Gens, PA-C  EPINEPHrine 0.3 mg/0.3 mL IJ SOAJ injection Inject 0.3 mLs (0.3 mg total) into the muscle once. Patient not taking: Reported on 04/05/2018 01/19/16   Ofilia Neas, PA-C  ibuprofen (ADVIL,MOTRIN) 800 MG tablet Take 1 tablet (800 mg total) by mouth 3 (three) times daily. Patient not taking: Reported on 04/05/2018 12/03/17   McDonald, Mia A, PA-C  lidocaine (XYLOCAINE) 2 % solution Use as directed 15 mLs in the mouth or throat every 3 (three) hours as needed for mouth pain. Patient not taking: Reported on 04/05/2018 12/03/17   McDonald, Pedro Earls A, PA-C  meloxicam (MOBIC) 7.5 MG tablet Take 1 tablet (7.5 mg total) daily by mouth. Patient not taking: Reported on 04/05/2018 05/22/17   Belinda Fisher, PA-C  predniSONE (DELTASONE) 20 MG tablet Take 2 tablets (40 mg total) by mouth daily. 04/05/18    Eyvonne Mechanic, PA-C    Family History Family History  Problem Relation Age of Onset  . Diabetes Maternal Grandfather   . Diabetes Paternal Grandmother     Social History Social History   Tobacco Use  . Smoking status: Current Every Day Smoker    Types: Cigarettes  . Smokeless tobacco: Never Used  Substance Use Topics  . Alcohol use: No  . Drug use: No     Allergies   Cashew nut oil; Other; Peanut-containing drug products; Calamari oil [squid oil]; Dust mite extract; Mango flavor [flavoring agent]; Pollen extract; and Tomato   Review of Systems Review of Systems  All other systems reviewed and are negative.    Physical Exam Updated Vital Signs BP 127/90   Pulse 92   Temp 98.5 F (36.9 C) (Oral)   Resp 18   Ht 5\' 1"  (1.549 m)   Wt 77.1 kg   LMP 04/05/2018   SpO2 94%   BMI 32.12 kg/m   Physical Exam  Constitutional: She is oriented to person, place, and time. She appears well-developed and well-nourished.  HENT:  Head: Normocephalic and atraumatic.  Eyes: Pupils are equal, round, and reactive to light. Conjunctivae are normal. Right eye exhibits no discharge. Left eye exhibits no discharge. No scleral icterus.  Neck: Normal range of motion. No JVD present. No tracheal deviation present.  Cardiovascular: Normal rate, regular rhythm, normal heart sounds and intact distal pulses. Exam reveals no gallop and no friction rub.  No murmur heard. Pulmonary/Chest: Effort normal. No stridor.  Minor bilateral expiratory wheeze, no crackles no respiratory distress  Neurological: She is alert and oriented to person, place, and time. Coordination normal.  Psychiatric: She has a normal mood and affect. Her behavior is normal. Judgment and thought content normal.  Nursing note and vitals reviewed.    ED Treatments / Results  Labs (all labs ordered are listed, but only abnormal results are displayed) Labs Reviewed - No data to display  EKG None  Radiology Dg Chest  2 View  Result Date: 04/05/2018 CLINICAL DATA:  Shortness of breath and wheezing for 3 days EXAM: CHEST - 2 VIEW COMPARISON:  Dec 09, 2010 FINDINGS: Lungs are clear. Heart size and pulmonary vascularity are normal. No adenopathy. No bone lesions. IMPRESSION: No edema or consolidation. Electronically Signed   By: Bretta Bang III M.D.   On: 04/05/2018 13:52    Procedures Procedures (including critical care time)  Medications Ordered in ED Medications  ipratropium-albuterol (DUONEB) 0.5-2.5 (3) MG/3ML nebulizer solution 3 mL (3 mLs Nebulization Given 04/05/18 1525)  predniSONE (DELTASONE) tablet 60 mg (60 mg Oral Given  04/05/18 1524)  albuterol (PROVENTIL) (2.5 MG/3ML) 0.083% nebulizer solution 2.5 mg (2.5 mg Nebulization Given 04/05/18 1635)     Initial Impression / Assessment and Plan / ED Course  I have reviewed the triage vital signs and the nursing notes.  Pertinent labs & imaging results that were available during my care of the patient were reviewed by me and considered in my medical decision making (see chart for details).     Labs:   Imaging: DG chest 2 view   Consults:  Therapeutics:  Discharge Meds: Prednisone, albuterol  Assessment/Plan: 37 year old female presents today with likely asthma exacerbation.  Patient has wheezing on exam very minimal improved with breathing treatment, no respiratory distress.  Patient has no signs of bacterial pneumonia.  Patient be discharged with prednisone, albuterol, strict return precautions and follow-up information.  Verbalized understanding and agreement to today's plan had no further questions or concerns.    Final Clinical Impressions(s) / ED Diagnoses   Final diagnoses:  Moderate asthma with exacerbation, unspecified whether persistent    ED Discharge Orders         Ordered    albuterol (PROVENTIL HFA;VENTOLIN HFA) 108 (90 Base) MCG/ACT inhaler  Every 6 hours PRN     04/05/18 1741    predniSONE (DELTASONE) 20 MG tablet   Daily     04/05/18 1741           HedgesTinnie Gens, PA-C 04/05/18 2212    Ryo Klang, Tinnie Gens, PA-C 04/05/18 2213    Alvira Monday, MD 04/07/18 1340

## 2018-04-05 NOTE — ED Triage Notes (Signed)
Pt states that she has never been diagnosed with asthma but reports a cough for a week with SOB today.

## 2018-04-05 NOTE — ED Triage Notes (Signed)
Pt reports using a friends inhaler today around 10 with some relief but states that the SOB came back.

## 2018-04-05 NOTE — ED Notes (Signed)
Pt deemed not to be appropriate by EDP for fast track

## 2018-04-05 NOTE — ED Provider Notes (Signed)
Patient placed in Quick Look pathway, seen and evaluated   Chief Complaint: sob and cough   HPI:  37 y.o. female who presents emergency department today for 1 week history of nasal congestion, rhinorrhea, itchy/watery eyes, sneezing, cough and shortness of breath.  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  She borrowed a friend's albuterol inhaler which provided her some relief. No other interventions PTA.  He denies history of COPD, CHF.  She denies any fever, chills, sore throat, chest pain. Denies risk factors for PE including exogenous estrogen use, recent surgery or travel, trauma, immobilization,  previous blood clot, hemoptysis, hx of cancer, lower extremity pain or swelling.   ROS:  Positive ROS: (+) sob, cough Negative ROS: (-) fever, chest pain  Physical Exam:   Gen: No distress  Neuro: Awake and Alert  Skin: Warm    Focused Exam: Heart RRR, nml S1,S2, no m/r/g; faint expiratory wheeze throughout; Abd soft, NT, no rebound or guarding; Ext 2+ pedal pulses bilaterally, no edema.  BP 140/83 (BP Location: Right Arm)   Pulse 98   Temp 98.5 F (36.9 C) (Oral)   Resp 16   Ht 5\' 1"  (1.549 m)   Wt 77.1 kg   LMP 04/05/2018   SpO2 94%   BMI 32.12 kg/m   Plan:  Based on initial evaluation, labs are not indicated and radiology studies are indicated.  Patient counseled on process, plan, and necessity for staying for completing the evaluation."  Initiation of care has begun. The patient has been counseled on the process, plan, and necessity for staying for the completion/evaluation, and the remainder of the medical screening examination    Princella Pellegrini 04/05/18 1325    Little, Ambrose Finland, MD 04/06/18 1515

## 2018-04-05 NOTE — Discharge Instructions (Addendum)
Please read attached information. If you experience any new or worsening signs or symptoms please return to the emergency room for evaluation. Please follow-up with your primary care provider or specialist as discussed. Please use medication prescribed only as directed and discontinue taking if you have any concerning signs or symptoms.   °
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
## 2018-11-07 ENCOUNTER — Ambulatory Visit (HOSPITAL_COMMUNITY)
Admission: EM | Admit: 2018-11-07 | Discharge: 2018-11-07 | Disposition: A | Discharge status: Home / Self Care | Payer: PRIVATE HEALTH INSURANCE | Attending: Emergency Medicine | Admitting: Emergency Medicine

## 2018-11-07 ENCOUNTER — Other Ambulatory Visit: Payer: Self-pay

## 2018-11-07 ENCOUNTER — Encounter (HOSPITAL_COMMUNITY): Payer: Self-pay

## 2018-11-07 DIAGNOSIS — M25561 Pain in right knee: Secondary | ICD-10-CM

## 2018-11-07 DIAGNOSIS — M545 Low back pain, unspecified: Secondary | ICD-10-CM

## 2018-11-07 DIAGNOSIS — M25571 Pain in right ankle and joints of right foot: Secondary | ICD-10-CM

## 2018-11-07 MED ORDER — KETOROLAC TROMETHAMINE 60 MG/2ML IM SOLN
INTRAMUSCULAR | Status: AC
  Filled 2018-11-07: qty 2

## 2018-11-07 MED ORDER — KETOROLAC TROMETHAMINE 60 MG/2ML IM SOLN
60.0000 mg | Freq: Once | INTRAMUSCULAR | Status: AC
  Administered 2018-11-07: 60 mg via INTRAMUSCULAR

## 2018-11-07 MED ORDER — NAPROXEN 500 MG PO TABS: 500.0000 mg | ORAL_TABLET | Freq: Two times a day (BID) | ORAL | 0 refills | Status: AC

## 2018-11-07 MED ORDER — CYCLOBENZAPRINE HCL 10 MG PO TABS: 10.0000 mg | ORAL_TABLET | Freq: Every day | ORAL | 0 refills | Status: DC
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
## 2018-11-07 NOTE — Discharge Instructions (Addendum)
Declines brace today Rest, ice and heat as needed Ensure adequate ROM as tolerated. Injuries all appear to be muscular in nature. Prescribed naproxen as needed for inflammation and pain relief.  DO NOT TAKE with other antiinflammatories as this may cause GI upset and/or bleed.   Prescribed flexeril as needed at bedtime for muscle spasm.  Do not drive or operate heavy machinery while taking this medication Expect some increased pain in the next 1-3 days.  It may take 3-4 weeks for complete resolution of symptoms Will f/u with her doctor or here if not seeing significant improvement within one week. Return here or go to ER if you have any new or worsening symptoms such as numbness/tingling of the inner thighs, loss of bladder or bowel control, headache/blurry vision, nausea/vomiting, confusion/altered mental status, dizziness, weakness, passing out, imbalance, etc..Marland Kitchen
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
## 2019-07-28 ENCOUNTER — Other Ambulatory Visit: Payer: Self-pay

## 2019-07-28 ENCOUNTER — Encounter (HOSPITAL_COMMUNITY): Payer: Self-pay | Admitting: Urgent Care

## 2019-07-28 ENCOUNTER — Ambulatory Visit (HOSPITAL_COMMUNITY)
Admission: EM | Admit: 2019-07-28 | Discharge: 2019-07-28 | Disposition: A | Discharge status: Home / Self Care | Payer: 59 | Attending: Urgent Care | Admitting: Urgent Care

## 2019-07-28 DIAGNOSIS — M7918 Myalgia, other site: Secondary | ICD-10-CM | POA: Diagnosis not present

## 2019-07-28 MED ORDER — MELOXICAM 7.5 MG PO TABS: 7.5000 mg | ORAL_TABLET | Freq: Every day | ORAL | 0 refills | Status: AC

## 2019-07-28 MED ORDER — CYCLOBENZAPRINE HCL 10 MG PO TABS: 10.0000 mg | ORAL_TABLET | Freq: Every evening | ORAL | 0 refills | Status: AC | PRN
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
## 2022-03-29 ENCOUNTER — Emergency Department (HOSPITAL_COMMUNITY)
Admission: EM | Admit: 2022-03-29 | Discharge: 2022-03-30 | Discharge status: Against Medical Advice | Payer: 59 | Attending: Emergency Medicine | Admitting: Emergency Medicine

## 2022-03-29 ENCOUNTER — Other Ambulatory Visit: Payer: Self-pay

## 2022-03-29 ENCOUNTER — Ambulatory Visit (HOSPITAL_COMMUNITY)
Admission: EM | Admit: 2022-03-29 | Discharge: 2022-03-29 | Disposition: A | Discharge status: Home / Self Care | Payer: 59

## 2022-03-29 ENCOUNTER — Encounter (HOSPITAL_COMMUNITY): Payer: Self-pay | Admitting: Emergency Medicine

## 2022-03-29 DIAGNOSIS — R0602 Shortness of breath: Secondary | ICD-10-CM | POA: Insufficient documentation

## 2022-03-29 DIAGNOSIS — J45909 Unspecified asthma, uncomplicated: Secondary | ICD-10-CM | POA: Insufficient documentation

## 2022-03-29 DIAGNOSIS — Z5321 Procedure and treatment not carried out due to patient leaving prior to being seen by health care provider: Secondary | ICD-10-CM | POA: Insufficient documentation

## 2022-03-29 HISTORY — DX: Unspecified asthma, uncomplicated: J45.909

## 2022-03-29 MED ORDER — ALBUTEROL SULFATE (2.5 MG/3ML) 0.083% IN NEBU
2.5000 mg | INHALATION_SOLUTION | Freq: Once | RESPIRATORY_TRACT | Status: AC
  Administered 2022-03-29: 2.5 mg via RESPIRATORY_TRACT
  Filled 2022-03-29: qty 3

## 2022-03-29 MED ORDER — PREDNISONE 20 MG PO TABS
60.0000 mg | ORAL_TABLET | Freq: Once | ORAL | Status: AC
  Administered 2022-03-29: 60 mg via ORAL
  Filled 2022-03-29: qty 3

## 2022-03-29 NOTE — ED Triage Notes (Signed)
Patient reports asthma attack today with dry cough , chest congestion and wheezing , she ran out of her inhaler. No fever or chills .

## 2022-03-29 NOTE — ED Provider Triage Note (Signed)
Emergency Medicine Provider Triage Evaluation Note  [name] , a 41 y.o. female  was evaluated in triage.  Pt complains of SOB. Started at 330 am this morning while taking a shower. Improved but worsened until 6am. Does not have an inhaler or nebulizer at this time. Ran out of refills about 5 months ago. No symptoms related to her asthma until today. No fever. Coughing with clear mucus.   Review of Systems  Positive: See above Negative: See above  Physical Exam  BP (!) 160/85 (BP Location: Right Arm)   Pulse 73   Temp 98.3 F (36.8 C) (Oral)   Resp 19   SpO2 99%  Gen:   Awake, no distress   Resp:  Labored and diffuse wheezes. No crackles MSK:   Moves extremities without difficulty  Other:    Medical Decision Making  Medically screening exam initiated at 9:25 PM.  Appropriate orders placed.  [name] was informed that the remainder of the evaluation will be completed by another provider, this initial triage assessment does not replace that evaluation, and the importance of remaining in the ED until their evaluation is complete.  Plan: neb albuterol and predisone   Harriet Pho, PA-C 03/29/22 2132

## 2022-03-30 NOTE — ED Notes (Signed)
The nt called pt 3 or 4 times with no response.

## 2023-02-27 ENCOUNTER — Ambulatory Visit (HOSPITAL_COMMUNITY)
Admission: EM | Admit: 2023-02-27 | Discharge: 2023-02-27 | Disposition: A | Discharge status: Home / Self Care | Payer: No Payment, Other | Attending: Psychiatry | Admitting: Psychiatry

## 2023-02-27 DIAGNOSIS — Z008 Encounter for other general examination: Secondary | ICD-10-CM | POA: Insufficient documentation

## 2023-02-27 DIAGNOSIS — Z5900 Homelessness unspecified: Secondary | ICD-10-CM | POA: Insufficient documentation

## 2023-02-27 DIAGNOSIS — Z597 Insufficient social insurance and welfare support: Secondary | ICD-10-CM | POA: Insufficient documentation

## 2023-02-27 DIAGNOSIS — Z59819 Housing instability, housed unspecified: Secondary | ICD-10-CM

## 2023-02-27 NOTE — ED Provider Notes (Signed)
Behavioral Health Urgent Care Medical Screening Exam  Patient Name: FRETA YOSHINAGA MRN: [id number] Date of Evaluation: 02/27/23 Chief Complaint:   Diagnosis:  Final diagnoses:  Housing insecurity    History of Present illness: BEKKI MAN is a 42 y.o. female.  Presents to [facility] urgent care seeking housing resources.  She reports she was referred by multiple homeless agencies. "  If I get a mental health evaluation that will help with my current housing situation."  She reports she carries a diagnosis with major depressive disorder, bipolar disorder, personality disorder, obsessive-compulsive disorder, posttraumatic stress disorder and generalized anxiety disorder.  She denied that she is followed by therapy or psychiatry currently.  Denies that she is prescribed any psychotropic medications.  States she has not had any medications for at least 8 years. She is denying suicidal or homicidal ideations.  Denies auditory visual hallucinations.  Does admit to occasional marijuana use.  Montoya states she has 12 children 10-year-old, 55 year old 57 year old and a 42 year old.  She reports 2 of her children is currently in DSS custody.  States she has had a open case for the past 6 years.  Dates she has been homeless for the past 2 years.  Reports she is currently seeking resources in order to get emergency housing.    Flonnie Hailstone is sitting in no acute distress. She is alert/oriented x 4; calm/cooperative; and mood congruent with affect. She is speaking in a clear tone at moderate volume, and normal pace; with good eye contact. Her thought process is coherent and relevant; There is no indication that she is currently responding to internal/external stimuli or experiencing delusional thought content; and she has denied suicidal/self-harm/homicidal ideation, psychosis, and paranoia.   Patient has remained calm throughout assessment and has answered questions appropriately.      VALINDA WEARS is educated and verbalizes understanding of mental health resources and other crisis services in the community. She is instructed to call 911 and present to the nearest emergency room should she experience any suicidal/homicidal ideation, auditory/visual/hallucinations, or detrimental worsening of her mental health condition. She was a also advised by Clinical research associate that she could call the toll-free phone on insurance card to assist with identifying in network counselors and agencies or number on back of Medicaid card to speak with care coordinator.    Flowsheet Row ED from 02/27/2023 in [facility] ED from 03/29/2022 in [facility] Emergency Department at [facility]  C-SSRS RISK CATEGORY No Risk No Risk       Psychiatric Specialty Exam  Presentation  General Appearance:Appropriate for Environment  Eye Contact:Good  Speech:Clear and Coherent  Speech Volume:Normal  Handedness:Right   Mood and Affect  Mood:Anxious; Depressed  Affect:Congruent   Thought Process  Thought Processes:Coherent  Descriptions of Associations:Intact  Orientation:Full (Time, Place and Person)  Thought Content:Logical    Hallucinations:None  Ideas of Reference:None  Suicidal Thoughts:No  Homicidal Thoughts:No   Sensorium  Memory:Immediate Fair; Recent Good; Remote Good  Judgment:Good  Insight:Good   Executive Functions  Concentration:Good  Attention Span:Good  Recall:Good  Fund of Knowledge:Good  Language:Fair   Psychomotor Activity  Psychomotor Activity:Normal   Assets  Assets:Desire for Improvement; Transportation; Financial Resources/Insurance; Housing   Sleep  Sleep:Fair  Number of hours: No data recorded  Physical Exam: Physical Exam Vitals and nursing note reviewed.  Constitutional:      Appearance: Normal appearance.  Cardiovascular:     Rate and Rhythm: Normal rate.     Pulses:  Normal pulses.     Heart  sounds: Normal heart sounds.  Neurological:     Mental Status: She is alert and oriented to person, place, and time.  Psychiatric:        Mood and Affect: Mood normal.        Behavior: Behavior normal.    Review of Systems  Psychiatric/Behavioral:  Negative for suicidal ideas. The patient is nervous/anxious.   All other systems reviewed and are negative.  Blood pressure (!) 160/105, pulse 87, temperature 98.1 F (36.7 C), temperature source Oral, resp. rate 18, SpO2 100%, unknown if currently breastfeeding. There is no height or weight on file to calculate BMI. -Reported history of hypertension denied that she is taking any medications for blood pressure -Additional outpatient resources made for [place] and wellness -Education provided with signs and symptoms related to stroke and/or cardiac events Musculoskeletal: Strength & Muscle Tone: within normal limits Gait & Station: normal Patient leans: N/A   [facility] MSE Discharge Disposition for Follow up and Recommendations: Based on my evaluation the patient does not appear to have an emergency medical condition and can be discharged with resources and follow up care in outpatient services for Individual Therapy   Oneta Rack, NP 02/27/2023, 3:48 PM

## 2023-02-27 NOTE — Progress Notes (Addendum)
   02/27/23 1126  [facility] Triage Screening (Walk-ins at [facility] only)  How Did You Hear About [place]? Self  What Is the Reason for Your Visit/Call Today? Pt presents to [facility] voluntarily seeking housing assistance. Pt states she is homeless with 4 children. Pt states she was sent to this facility for a assessment but is unsure what type of assessment, she reports she was told that we help with housing. This Clinical research associate explained that we provide crisis assessment  and the process. Pt stated she was hungry and would come back after she got something to eat, she left the building. Pt reports she is diagnosed with OCD,PTSD,BPD,Bipolar disorder, and is not prescribed medication.Pt denies SI/HI and AVH.  How Long Has This Been Causing You Problems? > than 6 months  Have You Recently Had Any Thoughts About Hurting Yourself? No  Are You Planning to Commit Suicide/Harm Yourself At This time? No  Have you Recently Had Thoughts About Hurting Someone Karolee Ohs? No  Are You Planning To Harm Someone At This Time? No  Are you currently experiencing any auditory, visual or other hallucinations? No  Have You Used Any Alcohol or Drugs in the Past 24 Hours? No  Do you have any current medical co-morbidities that require immediate attention? No  Clinician description of patient physical appearance/behavior: nervous, casually dressed  What Do You Feel Would Help You the Most Today? Housing Assistance;Social Support  If access to [facility] Urgent Care was not available, would you have sought care in the Emergency Department? No  Determination of Need Routine (7 days)  Options For Referral Outpatient Therapy;Other: Comment

## 2023-02-27 NOTE — BH Assessment (Signed)
TTS provided pt with homeless resources, and a bus pass. TTS reviewed AVS with patient to include outpatient services upstairs. Pt denies SI, HI, AVH and SIB.

## 2023-02-27 NOTE — Discharge Instructions (Signed)
Take all medications as prescribed. Keep all follow-up appointments as scheduled.  Do not consume alcohol or use illegal drugs while on prescription medications. Report any adverse effects from your medications to your primary care provider promptly.  In the event of recurrent symptoms or worsening symptoms, call 911, a crisis hotline, or go to the nearest emergency department for evaluation.   

## 2023-02-27 NOTE — ED Notes (Signed)
Patient was discharged by the provider. Patient was given an AVS with community resources.
